# Patient Record
Sex: Female | Born: 1975 | Race: White | Hispanic: No | Marital: Married | Smoking: Current every day smoker
Health system: Southern US, Community
[De-identification: ages and names within clinical notes are randomized; demographics above are authoritative.]

## PROBLEM LIST (undated history)

## (undated) DIAGNOSIS — Z87442 Personal history of urinary calculi: Secondary | ICD-10-CM

## (undated) DIAGNOSIS — A64 Unspecified sexually transmitted disease: Secondary | ICD-10-CM

## (undated) DIAGNOSIS — L0390 Cellulitis, unspecified: Secondary | ICD-10-CM

## (undated) DIAGNOSIS — O24919 Unspecified diabetes mellitus in pregnancy, unspecified trimester: Secondary | ICD-10-CM

## (undated) HISTORY — DX: Personal history of urinary calculi: Z87.442

## (undated) HISTORY — DX: Unspecified sexually transmitted disease: A64

## (undated) HISTORY — DX: Unspecified diabetes mellitus in pregnancy, unspecified trimester: O24.919

## (undated) HISTORY — DX: Cellulitis, unspecified: L03.90

---

## 2000-06-29 HISTORY — PX: OTHER SURGICAL HISTORY: SHX169

## 2004-05-19 ENCOUNTER — Inpatient Hospital Stay (HOSPITAL_COMMUNITY): Admission: AD | Admit: 2004-05-19 | Discharge: 2004-05-19 | Payer: Self-pay | Admitting: *Deleted

## 2004-05-25 ENCOUNTER — Inpatient Hospital Stay (HOSPITAL_COMMUNITY): Admission: AD | Admit: 2004-05-25 | Discharge: 2004-05-26 | Payer: Self-pay | Admitting: Family Medicine

## 2004-05-26 ENCOUNTER — Inpatient Hospital Stay (HOSPITAL_COMMUNITY): Admission: AD | Admit: 2004-05-26 | Discharge: 2004-05-26 | Payer: Self-pay | Admitting: Obstetrics and Gynecology

## 2006-11-29 ENCOUNTER — Ambulatory Visit: Payer: Self-pay | Admitting: Family Medicine

## 2006-11-29 ENCOUNTER — Ambulatory Visit (HOSPITAL_COMMUNITY): Admission: RE | Admit: 2006-11-29 | Discharge: 2006-11-29 | Payer: Self-pay | Admitting: Obstetrics & Gynecology

## 2006-12-06 ENCOUNTER — Ambulatory Visit: Payer: Self-pay | Admitting: Obstetrics & Gynecology

## 2006-12-13 ENCOUNTER — Ambulatory Visit: Payer: Self-pay | Admitting: Obstetrics & Gynecology

## 2006-12-20 ENCOUNTER — Ambulatory Visit: Payer: Self-pay | Admitting: Obstetrics & Gynecology

## 2006-12-27 ENCOUNTER — Ambulatory Visit: Payer: Self-pay | Admitting: Obstetrics and Gynecology

## 2007-01-03 ENCOUNTER — Ambulatory Visit: Payer: Self-pay | Admitting: Family Medicine

## 2007-01-10 ENCOUNTER — Ambulatory Visit: Payer: Self-pay | Admitting: Obstetrics and Gynecology

## 2007-01-17 ENCOUNTER — Ambulatory Visit: Payer: Self-pay | Admitting: Obstetrics & Gynecology

## 2007-01-24 ENCOUNTER — Ambulatory Visit: Payer: Self-pay | Admitting: Obstetrics and Gynecology

## 2007-01-31 ENCOUNTER — Ambulatory Visit: Payer: Self-pay | Admitting: Family Medicine

## 2007-02-07 ENCOUNTER — Ambulatory Visit: Payer: Self-pay | Admitting: Family Medicine

## 2007-02-11 ENCOUNTER — Inpatient Hospital Stay (HOSPITAL_COMMUNITY): Admission: AD | Admit: 2007-02-11 | Discharge: 2007-02-11 | Payer: Self-pay | Admitting: Obstetrics & Gynecology

## 2007-02-11 ENCOUNTER — Ambulatory Visit: Payer: Self-pay | Admitting: Obstetrics & Gynecology

## 2007-02-14 ENCOUNTER — Ambulatory Visit (HOSPITAL_COMMUNITY): Admission: RE | Admit: 2007-02-14 | Discharge: 2007-02-14 | Payer: Self-pay | Admitting: Obstetrics & Gynecology

## 2007-02-14 ENCOUNTER — Ambulatory Visit: Payer: Self-pay | Admitting: Obstetrics & Gynecology

## 2007-02-21 ENCOUNTER — Inpatient Hospital Stay (HOSPITAL_COMMUNITY): Admission: AD | Admit: 2007-02-21 | Discharge: 2007-02-21 | Payer: Self-pay | Admitting: Obstetrics & Gynecology

## 2007-02-21 ENCOUNTER — Ambulatory Visit: Payer: Self-pay | Admitting: Physician Assistant

## 2007-02-21 ENCOUNTER — Ambulatory Visit: Payer: Self-pay | Admitting: Obstetrics & Gynecology

## 2007-02-23 ENCOUNTER — Ambulatory Visit: Payer: Self-pay | Admitting: *Deleted

## 2007-02-23 ENCOUNTER — Inpatient Hospital Stay (HOSPITAL_COMMUNITY): Admission: AD | Admit: 2007-02-23 | Discharge: 2007-02-24 | Payer: Self-pay | Admitting: Obstetrics & Gynecology

## 2008-03-14 IMAGING — US US OB FOLLOW-UP
1 series · 14 of 25 positions shown · non-contrast
Comparison: none

OBSTETRICAL ULTRASOUND:

 This ultrasound exam was performed in the [HOSPITAL] Ultrasound Department.  The OB US report was generated in the AS system, and faxed to the ordering physician.  This report is also available in [REDACTED] PACS.

[Series 1: us ob follow-up · 0.30mm/px · 14 of 25 slices shown]
[im 1/25]
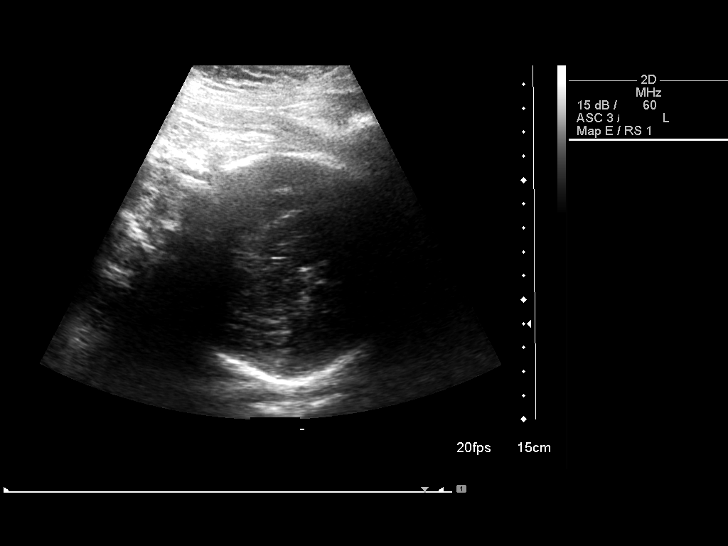
[im 3/25]
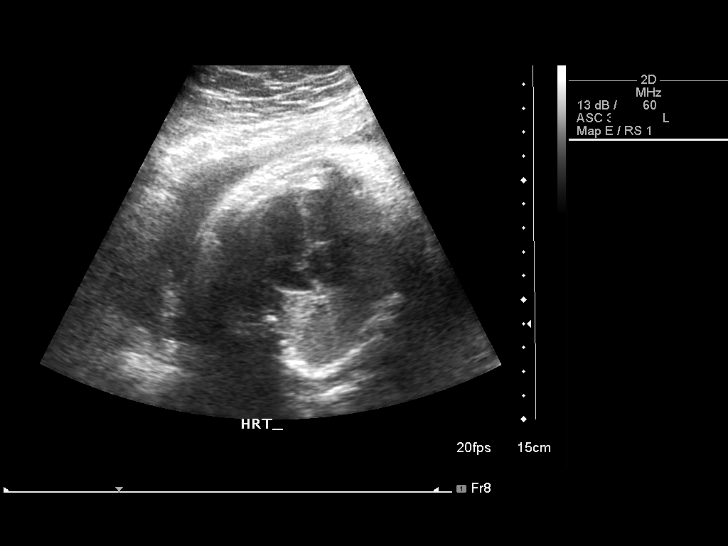
[im 5/25]
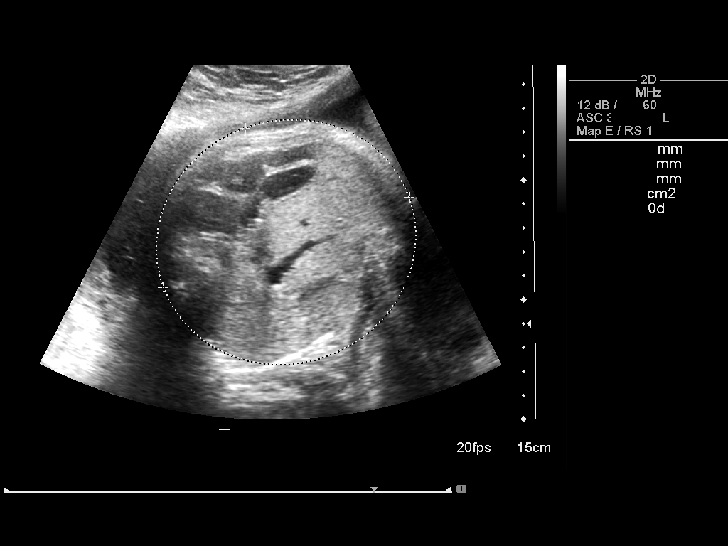
[im 7/25]
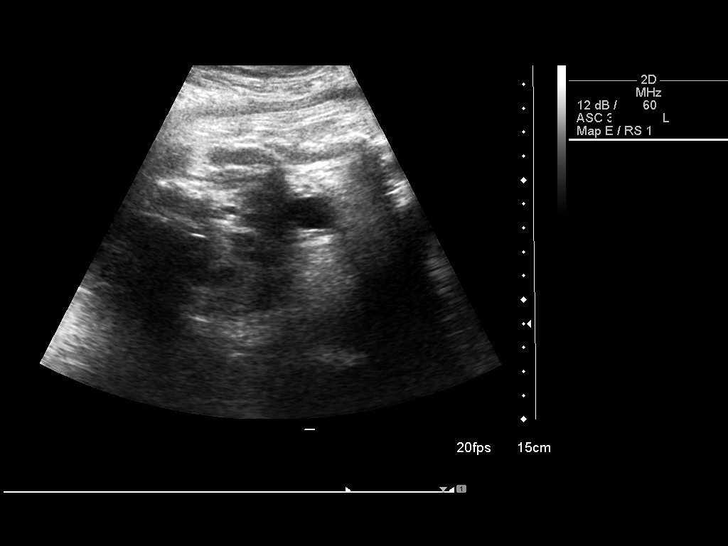
[im 9/25]
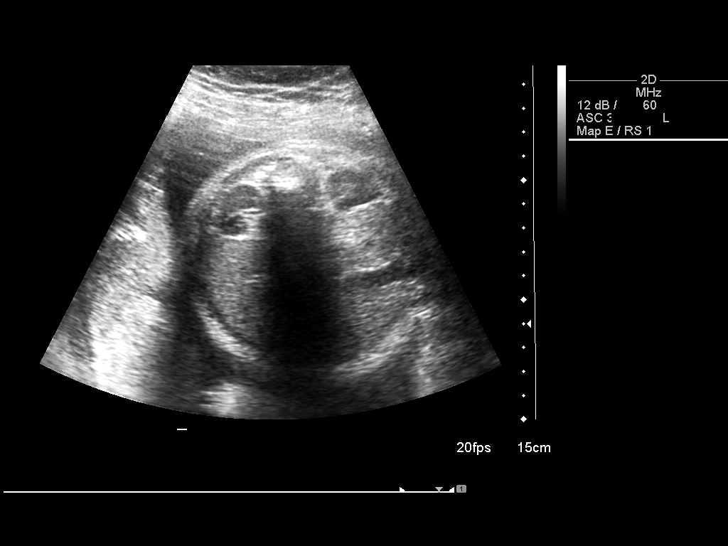
[im 10/25]
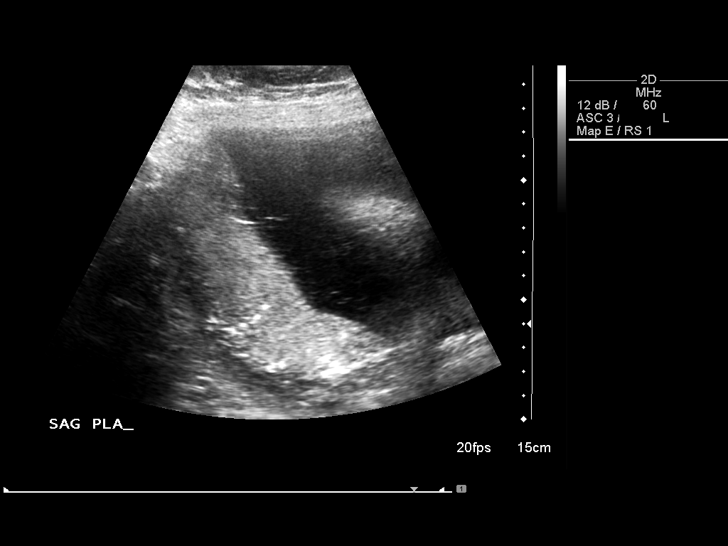
[im 12/25]
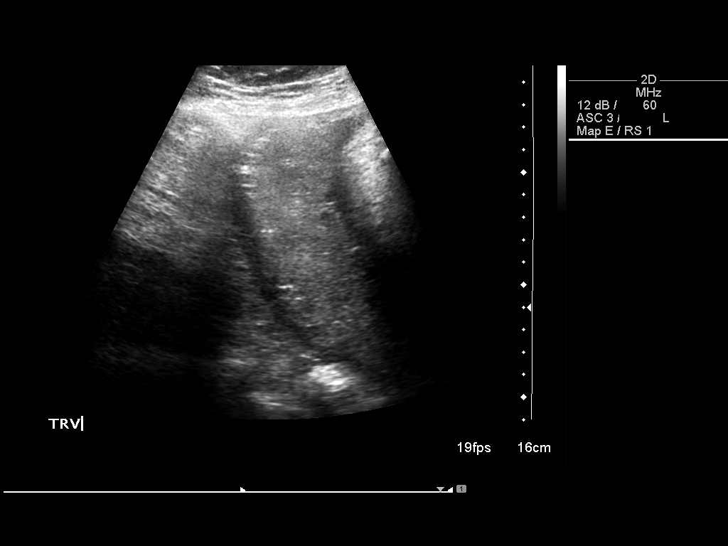
[im 14/25]
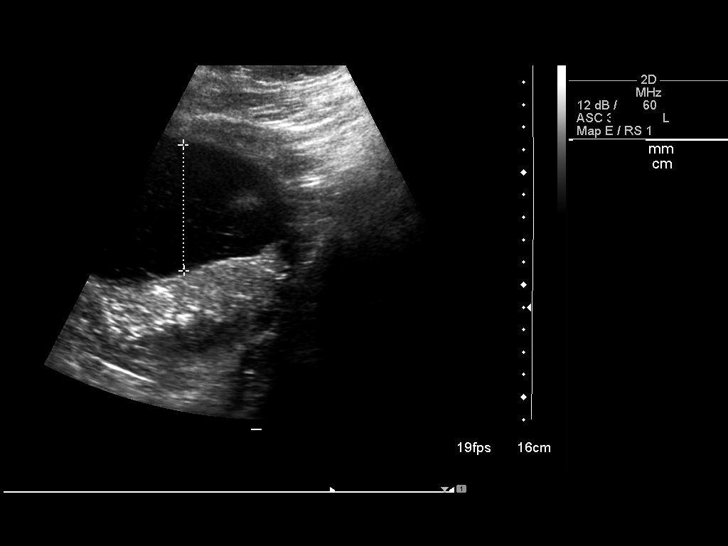
[im 16/25]
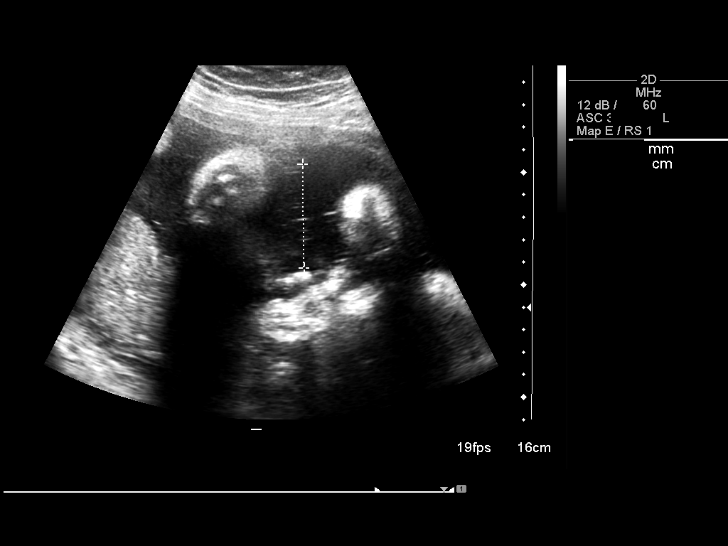
[im 17/25]
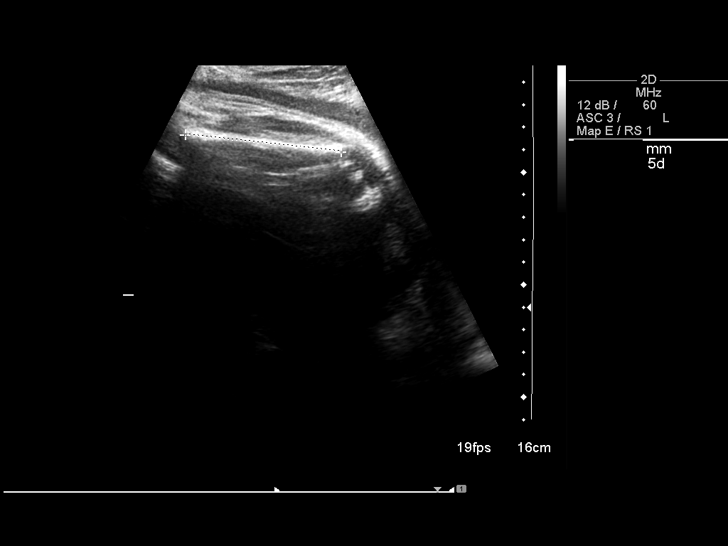
[im 19/25]
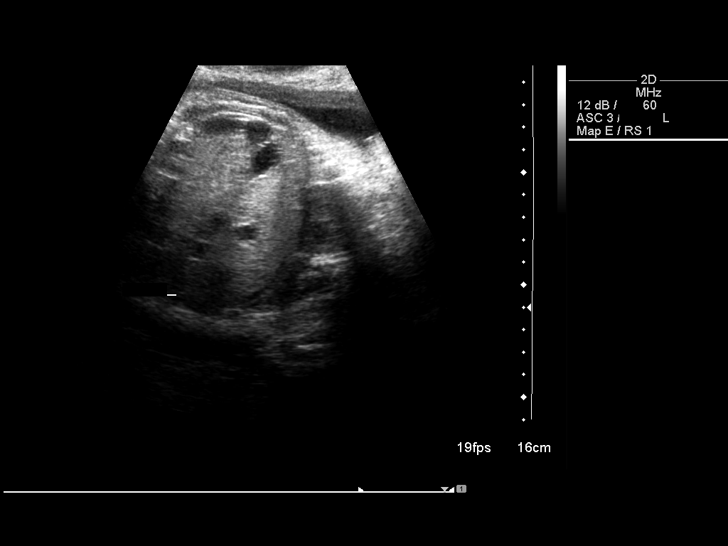
[im 21/25]
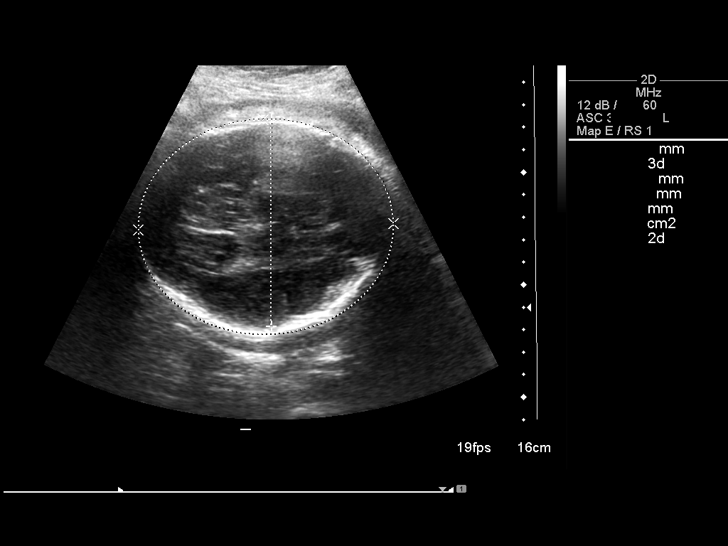
[im 23/25]
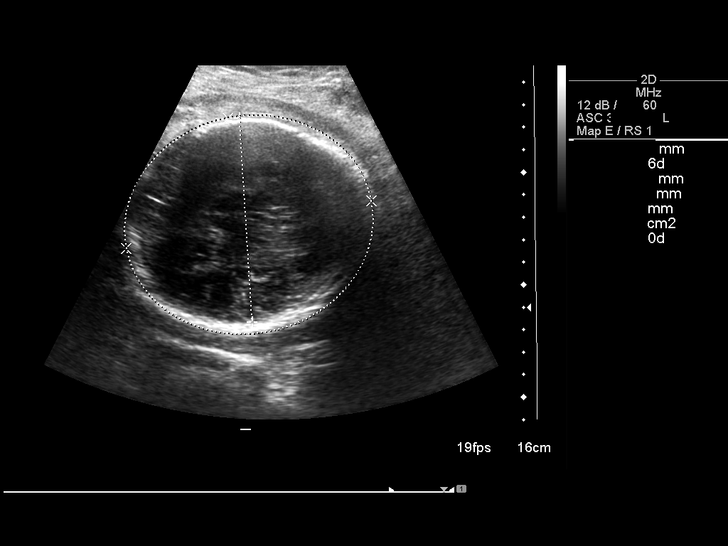
[im 25/25]
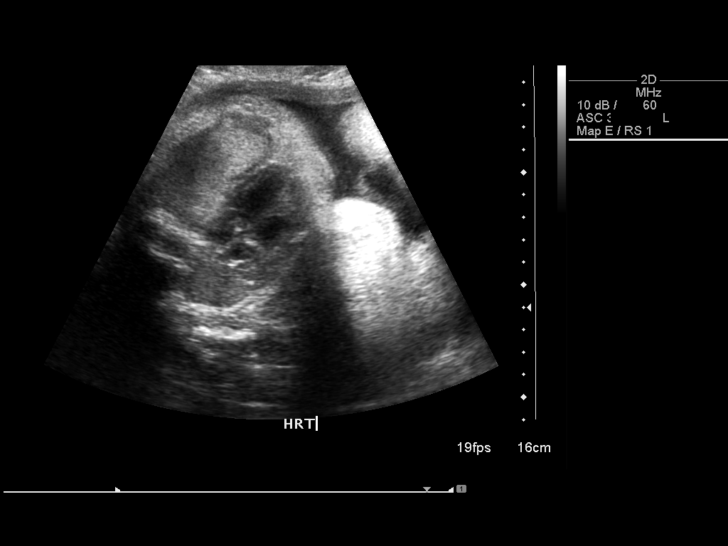

[14 of 25 positions shown; findings below may reference images not displayed]

IMPRESSION: See AS Obstetric US report.

## 2010-06-29 NOTE — L&D Delivery Note (Signed)
Delivery Note At 9:45 AM a viable female was delivered via  (Presentation:LOA ;  ).  APGAR:8 and 9 , ; weight .   Placenta status:spont via shultz , .  Cord:3 VC  with the following complications:none .    Anesthesia:  epidural Episiotomy: none Lacerations: none Suture Repair: none Est. Blood Loss350 (mL):   Mom to postpartum.  Baby to nursery-stable.  Zerita Boers 05/24/2011, 9:54 AM

## 2010-11-28 DIAGNOSIS — L0390 Cellulitis, unspecified: Secondary | ICD-10-CM

## 2010-11-28 DIAGNOSIS — A64 Unspecified sexually transmitted disease: Secondary | ICD-10-CM

## 2010-11-28 HISTORY — DX: Unspecified sexually transmitted disease: A64

## 2010-11-28 HISTORY — DX: Cellulitis, unspecified: L03.90

## 2011-03-07 LAB — GC/CHLAMYDIA PROBE AMP, GENITAL: Chlamydia: NEGATIVE

## 2011-03-18 ENCOUNTER — Ambulatory Visit (INDEPENDENT_AMBULATORY_CARE_PROVIDER_SITE_OTHER): Payer: Medicaid Other | Admitting: Obstetrics and Gynecology

## 2011-03-18 ENCOUNTER — Other Ambulatory Visit: Payer: Self-pay | Admitting: Obstetrics and Gynecology

## 2011-03-18 ENCOUNTER — Other Ambulatory Visit (HOSPITAL_COMMUNITY)
Admission: RE | Admit: 2011-03-18 | Discharge: 2011-03-18 | Disposition: A | Discharge status: Home / Self Care | Payer: Medicaid Other | Source: Ambulatory Visit | Attending: Obstetrics and Gynecology | Admitting: Obstetrics and Gynecology

## 2011-03-18 ENCOUNTER — Ambulatory Visit (HOSPITAL_COMMUNITY)
Admission: RE | Admit: 2011-03-18 | Discharge: 2011-03-18 | Disposition: A | Discharge status: Home / Self Care | Payer: Medicaid Other | Source: Ambulatory Visit | Attending: Obstetrics and Gynecology | Admitting: Obstetrics and Gynecology

## 2011-03-18 DIAGNOSIS — Z113 Encounter for screening for infections with a predominantly sexual mode of transmission: Secondary | ICD-10-CM | POA: Insufficient documentation

## 2011-03-18 DIAGNOSIS — O24919 Unspecified diabetes mellitus in pregnancy, unspecified trimester: Secondary | ICD-10-CM | POA: Insufficient documentation

## 2011-03-18 DIAGNOSIS — O0930 Supervision of pregnancy with insufficient antenatal care, unspecified trimester: Secondary | ICD-10-CM

## 2011-03-18 DIAGNOSIS — E1110 Type 2 diabetes mellitus with ketoacidosis without coma: Secondary | ICD-10-CM

## 2011-03-18 DIAGNOSIS — Z124 Encounter for screening for malignant neoplasm of cervix: Secondary | ICD-10-CM

## 2011-03-18 DIAGNOSIS — O409XX0 Polyhydramnios, unspecified trimester, not applicable or unspecified: Secondary | ICD-10-CM | POA: Insufficient documentation

## 2011-03-18 DIAGNOSIS — F1411 Cocaine abuse, in remission: Secondary | ICD-10-CM | POA: Insufficient documentation

## 2011-03-18 DIAGNOSIS — O34219 Maternal care for unspecified type scar from previous cesarean delivery: Secondary | ICD-10-CM | POA: Insufficient documentation

## 2011-03-18 DIAGNOSIS — O99810 Abnormal glucose complicating pregnancy: Secondary | ICD-10-CM

## 2011-03-18 DIAGNOSIS — Z01419 Encounter for gynecological examination (general) (routine) without abnormal findings: Secondary | ICD-10-CM | POA: Insufficient documentation

## 2011-03-18 HISTORY — DX: Cocaine abuse, in remission: F14.11

## 2011-03-18 LAB — POCT URINALYSIS DIP (DEVICE)
Bilirubin Urine: NEGATIVE
Glucose, UA: 500 mg/dL — AB
Ketones, ur: NEGATIVE mg/dL
Nitrite: NEGATIVE
Protein, ur: NEGATIVE mg/dL
Specific Gravity, Urine: 1.025 (ref 1.005–1.030)
Urobilinogen, UA: 0.2 mg/dL (ref 0.0–1.0)
pH: 5.5 (ref 5.0–8.0)

## 2011-03-18 NOTE — Progress Notes (Signed)
Addended by: Toula Moos on: 03/18/2011 04:26 PM   Modules accepted: Orders

## 2011-03-18 NOTE — Progress Notes (Signed)
Edema-feet, hx of preterm labor- was on 17p last pregnancy.  Spotted last week x 2.  White discharge, no odor.

## 2011-03-18 NOTE — Progress Notes (Signed)
This patient is a 35 year old white female gravida 9 para 37. History 1 cesarean section but she's had a VBAC since that time. He section was done for fetal distress. She had one crib death. 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 She has not had prenatal care up to this point should be 29 weeks 3 days based on her last period. She'll over measures significantly larger. She is a type II diabetic who is on Glucophage but has been off of that for the past 3 months because she thought it was dangers for the baby. She was originally taking 750 mg twice a day. She has a history of polyhydramnios with previous pregnancy. We'll get an ultrasound today to evaluate the size of the baby and referred the patient to high-risk clinic ASAP. I've told her to restart her Glucophage.

## 2011-03-19 LAB — OBSTETRIC PANEL
Antibody Screen: NEGATIVE
Basophils Absolute: 0 10*3/uL (ref 0.0–0.1)
Basophils Relative: 0 % (ref 0–1)
Eosinophils Absolute: 0 10*3/uL (ref 0.0–0.7)
Eosinophils Relative: 0 % (ref 0–5)
HCT: 37.9 % (ref 36.0–46.0)
Hemoglobin: 12.9 g/dL (ref 12.0–15.0)
Hepatitis B Surface Ag: NEGATIVE
Lymphocytes Relative: 23 % (ref 12–46)
Lymphs Abs: 3 10*3/uL (ref 0.7–4.0)
MCH: 28.9 pg (ref 26.0–34.0)
MCHC: 34 g/dL (ref 30.0–36.0)
MCV: 84.8 fL (ref 78.0–100.0)
Monocytes Absolute: 0.8 10*3/uL (ref 0.1–1.0)
Monocytes Relative: 6 % (ref 3–12)
Neutro Abs: 9.4 10*3/uL — ABNORMAL HIGH (ref 1.7–7.7)
Neutrophils Relative %: 71 % (ref 43–77)
Platelets: 278 10*3/uL (ref 150–400)
RBC: 4.47 MIL/uL (ref 3.87–5.11)
RDW: 13.5 % (ref 11.5–15.5)
Rh Type: POSITIVE
Rubella: 40.9 IU/mL — ABNORMAL HIGH
WBC: 13.3 10*3/uL — ABNORMAL HIGH (ref 4.0–10.5)

## 2011-03-19 LAB — HEMOGLOBIN A1C
Hgb A1c MFr Bld: 7.2 % — ABNORMAL HIGH (ref <5.7)
Mean Plasma Glucose: 160 mg/dL — ABNORMAL HIGH (ref <117)

## 2011-03-22 LAB — CULTURE, BETA STREP (GROUP B ONLY)

## 2011-03-22 LAB — CULTURE, OB URINE: Colony Count: 40000

## 2011-03-23 ENCOUNTER — Ambulatory Visit (INDEPENDENT_AMBULATORY_CARE_PROVIDER_SITE_OTHER): Payer: Medicaid Other | Admitting: Physician Assistant

## 2011-03-23 ENCOUNTER — Encounter: Payer: Medicaid Other | Attending: Obstetrics & Gynecology | Admitting: Dietician

## 2011-03-23 DIAGNOSIS — O99810 Abnormal glucose complicating pregnancy: Secondary | ICD-10-CM

## 2011-03-23 DIAGNOSIS — N760 Acute vaginitis: Secondary | ICD-10-CM

## 2011-03-23 DIAGNOSIS — O2390 Unspecified genitourinary tract infection in pregnancy, unspecified trimester: Secondary | ICD-10-CM

## 2011-03-23 DIAGNOSIS — O24919 Unspecified diabetes mellitus in pregnancy, unspecified trimester: Secondary | ICD-10-CM

## 2011-03-23 LAB — POCT URINALYSIS DIP (DEVICE)
Bilirubin Urine: NEGATIVE
Glucose, UA: 500 mg/dL — AB
Ketones, ur: NEGATIVE mg/dL
Leukocytes, UA: NEGATIVE
Nitrite: NEGATIVE
Protein, ur: NEGATIVE mg/dL
Specific Gravity, Urine: 1.025 (ref 1.005–1.030)
Urobilinogen, UA: 0.2 mg/dL (ref 0.0–1.0)
pH: 5 (ref 5.0–8.0)

## 2011-03-23 MED ORDER — ACCU-CHEK FASTCLIX LANCETS MISC: 1.0000 | Freq: Four times a day (QID) | Status: AC

## 2011-03-23 MED ORDER — FLUCONAZOLE 150 MG PO TABS: 150.0000 mg | ORAL_TABLET | Freq: Once | ORAL | Status: AC

## 2011-03-23 MED ORDER — GLUCOSE BLOOD VI STRP: ORAL_STRIP | Status: DC

## 2011-03-23 MED ORDER — GLYBURIDE 5 MG PO TABS
5.0000 mg | ORAL_TABLET | Freq: Every day | ORAL | Status: DC
Start: 2011-03-23 — End: 2011-03-30

## 2011-03-23 NOTE — Progress Notes (Signed)
Completed review of the carbohydrate restricted diet for pregnancy and diabetes.  This included carb counting, label reading and portions using the exchange list.  Review of the meter and glucose testing schedule.  Provided an Accu-Chek [employer] Kit (Nano) Lot [id number] and expiration 06/17/2012.   Counseled to check bl. Glucose fasting and 2 hours post-mea.  To keep blood glucose log and bring log to clinic.  Advised to keep a carb/food log as well .  Today, her glucose at 3.5 hours post meal was 176.  Will follow-up in clinic.  Provided handouts;  Nutrition, Diabetes, and Pregnancy; Carb counting reference, and monitor and pregnancy illustrations.  Maggie Shalonda Sachse, RN, RD, CDEl

## 2011-03-23 NOTE — Progress Notes (Signed)
Pt is not taking Metformin as prescribed. Only checking BS once daily. 2pp after lunch ~ 160. Reviewed risk of DM in pregnancy with pt and encouraged to check BS 4 x daily. Will start glyberide 5mg  at HS today; Maggie and Social work today. FU 1 week to re-eval BS, anticipate need for insulin. Last [place] 5 days ago: growth 47% and AFI 20cm (80%). Will start antenatal @ 32 weeks. Pt desires TOLAC, consent reviewed.

## 2011-03-23 NOTE — Progress Notes (Signed)
Addended by: August Luz on: 03/23/2011 11:17 AM   Modules accepted: Orders

## 2011-03-23 NOTE — Progress Notes (Signed)
Swelling in feet at times. Thick vaginal discharge. Itching and burning that is extreme. Pain level is 10.

## 2011-03-23 NOTE — Patient Instructions (Addendum)
Diabetes and Exercise Regular exercise is important and can help:   Control blood glucose (sugar).   Decrease blood pressure.   Control blood lipids (cholesterol and triglycerides).   Improve overall health.  BENEFITS FROM EXERCISE:  Improved fitness.   Improved flexibility.   Improved endurance.   Increased bone density.   Weight control.   Increased muscle strength.   Decreased body fat.   Improvement of the body's use of a hormone called insulin.   Increased insulin sensitivity.   Reduction of insulin needs.   Helps you feel better.   Reduces stress and tension.  People with diabetes who add exercise to their lifestyle gain additional benefits.   Weight loss.   Reduces appetite.   Improves body's use of blood glucose (sugar).   Decreases risk factors for heart disease:   Lowering of cholesterol and triglycerides.   Raising the level of good cholesterol (high-density lipoproteins [HDL]).   Lowering blood sugar.   Decreases blood pressure.  TYPE 1 DIABETES AND EXERCISE  Exercise will usually lower your blood glucose.   If blood glucose is greater than 240 mg/dl, check urine ketones. If ketones are present, do not exercise.   Location of the insulin injection sites may need to be adjusted with exercise. Avoid injecting insulin into areas of the body that will be exercised. For example, avoid injecting insulin into:   The arms when playing tennis.   The legs when jogging. For more information, discuss this with your caregiver.   Keep a record of:   Food intake.   Type and amount of exercise.   Expected peak times of insulin action.   Blood glucose (sugar) levels.  Do this before, during and after exercise. Review your records with your caregiver(s). This will help you to develop guidelines for adjusting food intake and/or insulin amounts.  TYPE 2 DIABETES AND EXERCISE  Regular physical activity can help control blood glucose.   Exercise is  important because it may:   Increase the body's sensitivity to insulin.   Improve blood glucose control.   Exercise reduces the risk of heart disease. It decreases serum cholesterol and triglycerides. It also lowers blood pressure.   Those who take insulin or oral hypoglycemic agents should watch for signs of hypoglycemia. These signs include dizziness, shaking, sweating, chills and confusion.   Body water is lost during exercise. It must be replaced. This will help to avoid loss of body fluids (dehydration) and/or heat stroke.  Be sure to talk to your caregiver before starting an exercise program to make sure it is safe for you. Remember, any activity is better than none.  Document Released: 09/05/2003 Document Re-Released: 04/12/2009 ExitCare Patient Information 2011 [employer].2000 Calorie Diabetic Diet The 2000 calorie diabetic diet is designed for eating up to 2000 calories each day. Following this diet and making healthy meal choices can help improve overall health. It controls blood sugar levels. It can also lower blood pressure and cholesterol. SERVING SIZES Measuring foods and serving sizes helps to make sure you are getting the right amount of food. The list below tells how big or small some common serving sizes are.  1 ounce (oz).........................4 stacked dice.   3 oz.....................................Marland KitchenDeck of cards.   1 teaspoon (tsp)..................Marland KitchenTip of little finger.   1 tablespoon (Tbsp)...........Marland KitchenMarland KitchenThumb.   2 Tbsp.................................Marland KitchenGolf ball.   1/2 cup................................Marland KitchenHalf of a fist.   1 cup...................................Marland KitchenA fist.  GUIDELINES FOR CHOOSING FOODS The goal of this diet is to eat a variety of foods  and limit calories to 2000 each day. This can be done by choosing foods that are low in calories and fat. The diet also suggests eating small amounts of food often. Doing this helps control your blood sugar  levels so they do not get too high or too low. Each meal or snack should contain a protein food source to help you feel more satisfied and to stabilize your blood sugar. Try to eat about the same amount of food around the same time each day. This includes weekend days, travel days, and days off work. Space your meals about 4 to 5 hours apart and add a snack between them if you wish. For example, a daily food plan could include breakfast, a morning snack, lunch, dinner, and an evening snack. Healthy meals and snacks include whole grains, vegetables, fruits, lean meats, poultry, fish, and dairy products. As you plan your meals, choose a variety of foods. Choose from the bread and starches, vegetables, fruit, dairy, and meat/protein groups. Examples of foods from each group are listed below with their suggested serving sizes. Use measuring cups and spoons to become familiar with what a healthy portion looks like. Bread and Starches Each serving equals 15 grams of carbohydrates 1 slice bread 1/4 bagel 3/4 cup or 1 cup cold cereal (unsweetened) 1/2 cup hot cereal or mashed potatoes 1 small potato (size of a computer mouse) 1/3 cup cooked pasta or rice 1/2 English muffin 1 cup broth-based soup 3 cups popcorn 4 to 6 whole-wheat crackers 1/2 cup cooked beans, peas, or corn Vegetables Each serving equals 5 grams of carbohydrates 1/2 cup cooked vegetables 1 cup raw vegetables 1/2 cup tomato juice Fruit Each serving equals 15 grams of carbohydrates 1 small apple, banana, or orange 1 1/4 cup watermelon or strawberries 1/2 cup applesauce (no sugar added) 2 Tbsp raisins 1/2 banana 1/2 cup unsweetened canned fruit 1/2 cup unsweetened fruit juice Dairy Each serving equals 12 to 15 grams of carbohydrates 1 cup fat-free milk 6 oz artificially sweetened yogurt 1 cup buttermilk 1 cup soy milk Meat/Protein 1 large egg 2 to 3 oz meat, poultry, or fish 1/2 cup cottage cheese 1 Tbsp peanut butter 1/2  cup tofu 1 oz cheese 1/4 cup tuna canned in water SAMPLE 2000 CALORIE DIET PLAN Breakfast  1 English muffin (2 carb choices)   Reduced fat cream cheese, 1 Tbsp   1 scrambled egg   1/2 grapefruit (1 carb choice)   Fat-free milk, 1 cup (1 carb choice)  Morning Snack  Artificially sweetened yogurt, 6 oz (1 carb choice)   2 Tbsp chopped nuts   1 small peach (1 carb choice)  Lunch  Grilled chicken sandwich:   1 hamburger bun (2 carb choices)   2 oz chicken breast   1 lettuce leaf   2 slices tomato   Reduced fat mayonnaise, 1 Tbsp   Carrot sticks, 1 cup   Celery, 1 cup   1 small apple (1 carb choice)   Fat-free milk, 1 cup (1 carb choice)  Afternoon Snack  1/2 cup low-fat cottage cheese   1 1/4 cups strawberries (1 carb choice)  Dinner  Steak fajitas:   2 oz lean steak   1 whole-wheat tortilla, 8 inches (1.5 carb choices)   Shredded lettuce, 1/4 cup   2 slices tomato   Salsa, 1/4 cup   Low-fat sour cream, 2 Tbsp   Brown rice, 1/3 cup (1 carb choice)   1 small orange (1 carb choice)  Evening  Snack  4 reduced fat whole-wheat crackers (1 carb choice)   1 Tbsp peanut butter   12 to 15 grapes (1 carb choice)  MEAL PLAN Use this worksheet to help you make a daily meal plan based on the 2000 calorie diabetic diet suggestions. The total amount of carbohydrates in your meal or snack is more important than making sure you include all of the food groups at every meal or snack. If you are using this plan to help you control your blood glucose, you may interchange carbohydrate containing foods (dairy, starches, and fruits). Choose a variety of fresh foods of varying colors and flavors. You can choose from the following foods to build your day's meals:  11 Starches.   4 Vegetables.   3 Fruits.   3 Dairy.   8 oz Meat.   Up to 6 Fats.  Your dietician can use this worksheet to help you decide how many servings and what types of foods are right for  you. Breakfast Food Group and Servings Food Choice Starches ____________________________________________ Dairy ______________________________________________ Fruit _______________________________________________ Meat _______________________________________________ Fat_________________________________________________ Serina Cowper Food Group and Servings Food Choice Starch _____________________________________________ Meat ______________________________________________ Vegetables __________________________________________ Fruit _______________________________________________ Dairy_______________________________________________ Fat_________________________________________________ Afternoon Snack Food Group and Servings Food Choice Starch_________________________________________________ Meat__________________________________________________ Fruit__________________________________________________ Linford Arnold Group and Servings Food Choice Starches ____________________________________________ Meat _______________________________________________ Dairy _______________________________________________ Vegetables ___________________________________________ Fruit ________________________________________________ Fat__________________________________________________ Evening Snack Food Group and Servings Food Choice Fruit _______________________________________________ Meat _______________________________________________ Starch ______________________________________________ Daily Totals Starches _________________________ Vegetables _______________________ Fruit ___________________________ Dairy ___________________________ Meat ____________________________ Fat _____________________________ Document Released: 01/05/2005 Document Re-Released: 12/03/2009 ExitCare Patient Information 2011 [place], [place].

## 2011-03-30 ENCOUNTER — Ambulatory Visit (INDEPENDENT_AMBULATORY_CARE_PROVIDER_SITE_OTHER): Payer: Medicaid Other | Admitting: Family Medicine

## 2011-03-30 DIAGNOSIS — O24919 Unspecified diabetes mellitus in pregnancy, unspecified trimester: Secondary | ICD-10-CM

## 2011-03-30 DIAGNOSIS — O99810 Abnormal glucose complicating pregnancy: Secondary | ICD-10-CM

## 2011-03-30 DIAGNOSIS — O0990 Supervision of high risk pregnancy, unspecified, unspecified trimester: Secondary | ICD-10-CM

## 2011-03-30 DIAGNOSIS — N760 Acute vaginitis: Secondary | ICD-10-CM

## 2011-03-30 DIAGNOSIS — O24419 Gestational diabetes mellitus in pregnancy, unspecified control: Secondary | ICD-10-CM | POA: Insufficient documentation

## 2011-03-30 LAB — POCT URINALYSIS DIP (DEVICE)
Bilirubin Urine: NEGATIVE
Glucose, UA: 500 mg/dL — AB
Hgb urine dipstick: NEGATIVE
Ketones, ur: NEGATIVE mg/dL
Nitrite: NEGATIVE
Protein, ur: NEGATIVE mg/dL
Specific Gravity, Urine: >=1.03 (ref 1.005–1.030)
Urobilinogen, UA: 0.2 mg/dL (ref 0.0–1.0)
pH: 5.5 (ref 5.0–8.0)

## 2011-03-30 MED ORDER — GLUCOSE BLOOD VI STRP: ORAL_STRIP | Status: DC

## 2011-03-30 MED ORDER — GLYBURIDE 5 MG PO TABS: 5.0000 mg | ORAL_TABLET | Freq: Every day | ORAL | Status: DC

## 2011-03-30 MED ORDER — GLYBURIDE 5 MG PO TABS: 5.0000 mg | ORAL_TABLET | Freq: Two times a day (BID) | ORAL | Status: DC

## 2011-03-30 MED ORDER — PRENATAL RX 60-1 MG PO TABS: 1.0000 | ORAL_TABLET | Freq: Every day | ORAL | Status: DC

## 2011-03-30 NOTE — Patient Instructions (Signed)
SEEK IMMEDIATE MEDICAL CARE IF:  You develop contractions that continue to become stronger, more regular, and closer together.   You have a gushing, burst or leaking of fluid from the vagina.   An oral temperature above 100.4 develops.   You have passage of blood-tinged mucus.   You develop vaginal bleeding.   You develop continuous belly (abdominal) pain.   You have low back pain that you never had before.   You feel the baby's head pushing down causing pelvic pressure.   The baby is not moving as much as it used to.  Document Released: 06/15/2005 Document Re-Released: 12/03/2009 [facility] Patient Information 2011 [place], [place].Gestational Diabetes Mellitus (GDM) Gestational diabetes mellitus (GDM) is diabetes that occurs only during pregnancy. This happens when the body cannot properly handle the glucose (sugar) that increases in the blood after eating. During pregnancy, insulin resistance (reduced sensitivity to insulin) occurs because of the release of hormones from the placenta. Usually, the pancreas of pregnant women produces enough insulin to overcome the resistance that occurs. However, in gestational diabetes, the insulin is there but it does not work effectively. If the resistance is severe enough that the pancreas does not produce enough insulin, extra glucose builds up in the blood.  WHO IS AT RISK FOR DEVELOPING GESTATIONAL DIABETES?  Women with a history of diabetes in the family.   Women over age 5.   Women who are overweight.   Women in certain ethnic groups (Hispanic, African American, Native American, [place] and [place] Islander).  WHAT CAN HAPPEN TO THE BABY? If the mother's blood glucose is too high while she is pregnant, the extra sugar will travel through the umbilical cord to the baby. Some of the problems the baby may have are:  Large Baby - If the baby receives too much sugar, the baby will gain more weight. This may cause the baby to be too large to be  born normally (vaginally) and a Cesarean section (C-section) may be needed.   Low Blood Glucose (hypoglycemia) - The baby makes extra insulin, in response to the extra sugar its gets from its mother. When the baby is born and no longer needs this extra insulin, the baby's blood glucose level may drop.   Jaundice (yellow coloring of the skin and eyes) - This is fairly common in babies. It is caused from a build-up of the chemical called bilirubin. This is rarely serious, but is seen more often in babies whose mothers had gestational diabetes.  RISKS TO THE MOTHER Women who have had gestational diabetes may be at higher risk for some problems, including:  Preeclampsia or toxemia, which includes problems with high blood pressure. Blood pressure and protein levels in the urine must be checked frequently.   Infections.   Cesarean section (C-section) for delivery.   Developing Type 2 diabetes later in life. About 30-50% will develop diabetes later, especially if obese.  DIAGNOSIS The hormones that cause insulin resistance are highest at about 24-28 weeks of pregnancy. If symptoms are experienced, they are much like symptoms you would normally expect during pregnancy.  GDM is often diagnosed using a two part method: 1. After 24-28 weeks of pregnancy, the woman drinks a glucose solution and takes a blood test. If the glucose level is high, a second test will be given.  2. Oral Glucose Tolerance Test (OGTT) which is 3 hours long - After not eating overnight, the blood glucose is checked. The woman drinks a glucose solution, and hourly blood glucose tests  are taken.  If the woman has risk factors for GDM, the caregiver may test earlier than 24 weeks of pregnancy. TREATMENT Treatment of GDM is directed at keeping the mother's blood glucose level normal, and may include:  Meal planning.   Taking insulin or other medicine to control your blood glucose level.   Exercise.   Keeping a daily record of  the foods you eat.   Blood glucose monitoring and keeping a record of your blood glucose levels.   May monitor ketone levels in the urine, although this is no longer considered necessary in most pregnancies.  HOME CARE INSTRUCTIONS While you are pregnant:  Follow your caregiver's advice regarding your prenatal appointments, meal planning, exercise, medicines, vitamins, blood and other tests, and physical activities.   Keep a record of your meals, blood glucose tests, and the amount of insulin you are taking (if any). Show this to your caregiver at every prenatal visit.   If you have GDM, you may have problems with hypoglycemia (low blood glucose). You may suspect this if you become suddenly dizzy, feel shaky, and/or weak. If you think this is happening and you have a glucose meter, try to test your blood glucose level. Follow your caregiver's advice for when and how to treat your low blood glucose. Generally, the 15:15 rule is followed: Treat by consuming 15 grams of carbohydrates, wait 15 minutes, and recheck blood glucose. Examples of 15 grams of carbohydrates are:   1 cup skim or low fat milk.    cup juice.   3-4 glucose tablets.   5-6 hard candies.   1 small box raisins.    cup regular soda pop.   Practice good hygiene, to avoid infections.   Do not smoke.  SEEK MEDICAL CARE IF:  You develop abnormal vaginal discharge, with or without itching.   You become weak and tired more than expected.   You seem to sweat a lot.   You have a sudden increase in weight, 5 pounds or more in one week.   You are losing weight, 3 pounds or more in a week.   Your blood glucose level is high, and you need instructions on what to do about it.  SEEK IMMEDIATE MEDICAL CARE IF:  You develop a severe headache.   You faint or pass out.   You develop nausea and vomiting.   You become disoriented or confused.   You have a convulsion.   You develop vision problems.   You develop  stomach pain.   You develop vaginal bleeding.   You develop uterine contractions.   You have leaking or a gush of fluid from the vagina.  AFTER YOU HAVE THE BABY:  Go to all of your follow-up appointments, and have blood tests as advised by your caregiver.   Maintain a healthy lifestyle, to prevent diabetes in the future. This includes:   Following a healthy meal plan.   Controlling your weight.   Getting enough exercise and proper rest.   Do not smoke.   Breastfeed your baby if you can. This will lower the chance of you and your baby developing diabetes later in life.  For more information about diabetes, go to the American Diabetes Association at: PMFashions.com.cy. For more information about gestational diabetes, go to the [employer] at: RentRule.com.au. Document Released: 09/21/2000 Document Re-Released: 06/03/2009 [facility] Patient Information 2011 [place], [place].

## 2011-03-30 NOTE — Progress Notes (Signed)
Subjective:    Caitlin Faulkner is a 35 y.o. female being seen today for her obstetrical visit. She is at [redacted]w[redacted]d gestation. Patient reports backache. Fetal movement: normal.  Menstrual History: OB History    Grav Para Term Preterm Abortions TAB SAB Ect Mult Living   9 6 3 3 2 1  0 1 0 7      Patient's last menstrual period was 08/23/2010.     Objective:    BP 109/73  Temp 98.8 F (37.1 C)  Wt 90.855 kg (200 lb 4.8 oz)  LMP 08/23/2010 FHT:  145 BPM  Uterine Size: 31 cm  Presentation: unsure   Fasting CBG: 119-119-202-133-130-118-180 2 hr after Breakfast: [phone] 2 hr after Lunch: 113-105-127 2 hr after Dinner: 129-148-121-112 QHS: 118-127-141-189-144  Assessment:    Pregnancy 31 and 2/[redacted] weeks  Gestational Diabetes, Class B  Plan:  Will increase Glyburide to 5mg  BID and recheck fasting at next visit. May need to start insulin if still elevated CBGs. Pt to see Seward Grater again today. Pt declines Flu vaccine today. Will get [place] @ 32 wks and start 2x/weekly NST next week. Thinks she may want BTL after delivery. Desires VBAC, last delivery was vaginal  Follow up in 1 Week.

## 2011-03-30 NOTE — Progress Notes (Signed)
Having low back pain. Pt would like to get the flu shot today. Needs refill on PNV

## 2011-04-06 ENCOUNTER — Ambulatory Visit (INDEPENDENT_AMBULATORY_CARE_PROVIDER_SITE_OTHER): Payer: Medicaid Other | Admitting: Obstetrics & Gynecology

## 2011-04-06 ENCOUNTER — Encounter: Payer: Medicaid Other | Attending: Obstetrics & Gynecology | Admitting: Dietician

## 2011-04-06 ENCOUNTER — Encounter: Payer: Self-pay | Admitting: Obstetrics & Gynecology

## 2011-04-06 ENCOUNTER — Ambulatory Visit (HOSPITAL_COMMUNITY)
Admission: RE | Admit: 2011-04-06 | Discharge: 2011-04-06 | Disposition: A | Discharge status: Home / Self Care | Payer: Medicaid Other | Source: Ambulatory Visit | Attending: Family Medicine | Admitting: Family Medicine

## 2011-04-06 VITALS — Temp 97.8°F | Wt 206.0 lb

## 2011-04-06 DIAGNOSIS — O09219 Supervision of pregnancy with history of pre-term labor, unspecified trimester: Secondary | ICD-10-CM

## 2011-04-06 DIAGNOSIS — O24919 Unspecified diabetes mellitus in pregnancy, unspecified trimester: Secondary | ICD-10-CM | POA: Insufficient documentation

## 2011-04-06 DIAGNOSIS — Z23 Encounter for immunization: Secondary | ICD-10-CM

## 2011-04-06 DIAGNOSIS — O0930 Supervision of pregnancy with insufficient antenatal care, unspecified trimester: Secondary | ICD-10-CM | POA: Insufficient documentation

## 2011-04-06 DIAGNOSIS — O24419 Gestational diabetes mellitus in pregnancy, unspecified control: Secondary | ICD-10-CM

## 2011-04-06 DIAGNOSIS — O09529 Supervision of elderly multigravida, unspecified trimester: Secondary | ICD-10-CM | POA: Insufficient documentation

## 2011-04-06 DIAGNOSIS — O34219 Maternal care for unspecified type scar from previous cesarean delivery: Secondary | ICD-10-CM | POA: Insufficient documentation

## 2011-04-06 DIAGNOSIS — Z713 Dietary counseling and surveillance: Secondary | ICD-10-CM | POA: Insufficient documentation

## 2011-04-06 DIAGNOSIS — O09299 Supervision of pregnancy with other poor reproductive or obstetric history, unspecified trimester: Secondary | ICD-10-CM | POA: Insufficient documentation

## 2011-04-06 DIAGNOSIS — O09899 Supervision of other high risk pregnancies, unspecified trimester: Secondary | ICD-10-CM | POA: Insufficient documentation

## 2011-04-06 DIAGNOSIS — O99810 Abnormal glucose complicating pregnancy: Secondary | ICD-10-CM | POA: Insufficient documentation

## 2011-04-06 DIAGNOSIS — Z8751 Personal history of pre-term labor: Secondary | ICD-10-CM | POA: Insufficient documentation

## 2011-04-06 HISTORY — DX: Supervision of other high risk pregnancies, unspecified trimester: O09.899

## 2011-04-06 LAB — POCT URINALYSIS DIP (DEVICE)
Bilirubin Urine: NEGATIVE
Glucose, UA: 500 mg/dL — AB
Ketones, ur: NEGATIVE mg/dL
Nitrite: NEGATIVE
Protein, ur: NEGATIVE mg/dL
Specific Gravity, Urine: 1.025 (ref 1.005–1.030)
Urobilinogen, UA: 0.2 mg/dL (ref 0.0–1.0)
pH: 5 (ref 5.0–8.0)

## 2011-04-06 MED ORDER — INSULIN REGULAR HUMAN 100 UNIT/ML IJ SOLN: 16.0000 [IU] | Freq: Every day | INTRAMUSCULAR | Status: DC

## 2011-04-06 MED ORDER — INFLUENZA VIRUS VACC SPLIT PF IM SUSP: 0.5000 mL | Freq: Once | INTRAMUSCULAR | Status: DC

## 2011-04-06 MED ORDER — INSULIN NPH (HUMAN) (ISOPHANE) 100 UNIT/ML ~~LOC~~ SUSP: 32.0000 [IU] | Freq: Every day | SUBCUTANEOUS | Status: DC

## 2011-04-06 MED ORDER — INSULIN NPH (HUMAN) (ISOPHANE) 100 UNIT/ML ~~LOC~~ SUSP: 12.0000 [IU] | Freq: Every day | SUBCUTANEOUS | Status: DC

## 2011-04-06 MED ORDER — INSULIN REGULAR HUMAN 100 UNIT/ML IJ SOLN: 12.0000 [IU] | Freq: Every day | INTRAMUSCULAR | Status: DC

## 2011-04-06 NOTE — Progress Notes (Signed)
Diabetes Education:  Completed insulin instruction for mixing insulin and in insulin injection.  Mix AM: NPH 32 units and Regular 16 units before breakfast.  Before dinner: Regular 12 units and Bedtime: NPH 12 units.  Completed return demonstration using sterile saline (Lot [id number], Expiration 07/30/2012)  Provided insulin start kit [id number] Expiration: 05/02/2016.  Maggie Cleona Doubleday, RN, RD, CDE

## 2011-04-06 NOTE — Progress Notes (Signed)
Pt c/o yeast infection last Tuesday "had clear, blood discharge" has not had any bleeding since that day. Pulse 88

## 2011-04-06 NOTE — Progress Notes (Signed)
Fasting-117,223,112,110,123,99  2 hr break 132, 159; 2 hr lunch 78,181,100,125; 2 hr dinner 92,116,130,102.  Will start weight based insulin based on .8 x 91 kg.  Refuses BTL.  [place] today 52% growth, nml flid and cervical length.  Pt had blood tinged mucous last week with yeast infection.  Nothing currently.

## 2011-04-07 ENCOUNTER — Telehealth: Payer: Self-pay | Admitting: *Deleted

## 2011-04-07 DIAGNOSIS — O24919 Unspecified diabetes mellitus in pregnancy, unspecified trimester: Secondary | ICD-10-CM

## 2011-04-07 DIAGNOSIS — F1411 Cocaine abuse, in remission: Secondary | ICD-10-CM

## 2011-04-07 MED ORDER — INSULIN NPH (HUMAN) (ISOPHANE) 100 UNIT/ML ~~LOC~~ SUSP: 32.0000 [IU] | Freq: Every morning | SUBCUTANEOUS | Status: DC

## 2011-04-07 MED ORDER — INSULIN REGULAR HUMAN 100 UNIT/ML IJ SOLN: 16.0000 [IU] | Freq: Every day | INTRAMUSCULAR | Status: DC

## 2011-04-07 MED ORDER — INSULIN SYRINGES (DISPOSABLE) U-100 1 ML MISC: 3.0000 | Freq: Every day | Status: DC

## 2011-04-07 NOTE — Telephone Encounter (Signed)
Pt states the pharmacy has requested a change to her Rx for insulin so that insurance will pay. She also needs insulin syringes. Her pharmacy is [facility]. I contacted the pharmacy for additional information and made appropriate changes to the medication orders. I left message for pt that she can pick up her Rx's today.

## 2011-04-09 ENCOUNTER — Ambulatory Visit (INDEPENDENT_AMBULATORY_CARE_PROVIDER_SITE_OTHER): Payer: Medicaid Other | Admitting: *Deleted

## 2011-04-09 VITALS — BP 100/59 | Wt 206.7 lb

## 2011-04-09 DIAGNOSIS — O24919 Unspecified diabetes mellitus in pregnancy, unspecified trimester: Secondary | ICD-10-CM

## 2011-04-09 DIAGNOSIS — B373 Candidiasis of vulva and vagina: Secondary | ICD-10-CM

## 2011-04-09 MED ORDER — TERCONAZOLE 0.4 % VA CREA: 1.0000 | TOPICAL_CREAM | Freq: Every day | VAGINAL | Status: AC

## 2011-04-09 NOTE — Progress Notes (Signed)
P 81 for NST only today.c/o yeast infection, treated 2 weeks ago with diflucan x1, after antibiotics, c/o vaginal itching with thick white discharge, discussed with Wynelle Bourgeois, CNM, orders given

## 2011-04-10 DIAGNOSIS — L0390 Cellulitis, unspecified: Secondary | ICD-10-CM

## 2011-04-10 LAB — CBC
HCT: 37
Hemoglobin: 12.8
MCHC: 34.4
MCV: 85.6
Platelets: 221
RBC: 4.34
RDW: 13.2
RDW: 13.3
WBC: 13.4 — ABNORMAL HIGH

## 2011-04-10 LAB — POCT URINALYSIS DIP (DEVICE)
Bilirubin Urine: NEGATIVE
Bilirubin Urine: NEGATIVE
Glucose, UA: >=1000 — AB
Glucose, UA: >=1000 — AB
Ketones, ur: NEGATIVE
Ketones, ur: NEGATIVE
Nitrite: NEGATIVE
Nitrite: POSITIVE — AB
Operator id: 120861
pH: 5.5

## 2011-04-10 LAB — WET PREP, GENITAL: Clue Cells Wet Prep HPF POC: NONE SEEN

## 2011-04-10 LAB — URINALYSIS, ROUTINE W REFLEX MICROSCOPIC
Bilirubin Urine: NEGATIVE
Glucose, UA: 500 — AB
Ketones, ur: NEGATIVE
Nitrite: NEGATIVE
Protein, ur: NEGATIVE
pH: 6

## 2011-04-13 ENCOUNTER — Encounter: Payer: Medicaid Other | Admitting: Dietician

## 2011-04-13 ENCOUNTER — Other Ambulatory Visit: Payer: Self-pay | Admitting: Obstetrics and Gynecology

## 2011-04-13 ENCOUNTER — Ambulatory Visit (INDEPENDENT_AMBULATORY_CARE_PROVIDER_SITE_OTHER): Payer: Medicaid Other | Admitting: Physician Assistant

## 2011-04-13 VITALS — Temp 98.4°F | Wt 210.6 lb

## 2011-04-13 DIAGNOSIS — O09219 Supervision of pregnancy with history of pre-term labor, unspecified trimester: Secondary | ICD-10-CM

## 2011-04-13 DIAGNOSIS — O24919 Unspecified diabetes mellitus in pregnancy, unspecified trimester: Secondary | ICD-10-CM

## 2011-04-13 LAB — POCT URINALYSIS DIP (DEVICE)
Glucose, UA: 100 mg/dL — AB
Hgb urine dipstick: NEGATIVE
Hgb urine dipstick: NEGATIVE
Ketones, ur: NEGATIVE
Ketones, ur: NEGATIVE
Nitrite: NEGATIVE
Nitrite: NEGATIVE
Protein, ur: NEGATIVE mg/dL
Protein, ur: NEGATIVE
Protein, ur: NEGATIVE
Urobilinogen, UA: 0.2 mg/dL (ref 0.0–1.0)
pH: 6
pH: 6

## 2011-04-13 MED ORDER — INSULIN NPH (HUMAN) (ISOPHANE) 100 UNIT/ML ~~LOC~~ SUSP: 32.0000 [IU] | Freq: Every morning | SUBCUTANEOUS | Status: DC

## 2011-04-13 MED ORDER — FLUCONAZOLE 150 MG PO TABS: 150.0000 mg | ORAL_TABLET | Freq: Once | ORAL | Status: AC

## 2011-04-13 MED ORDER — INSULIN REGULAR HUMAN 100 UNIT/ML IJ SOLN: 16.0000 [IU] | Freq: Every day | INTRAMUSCULAR | Status: DC

## 2011-04-13 NOTE — Progress Notes (Signed)
04/13/2011 Diabetes Education:  Seen for review of new insulin dose and providing her a written list as she request.  NPH @ bedtime increased to 14 units.  Breakfast remains Regular:16 Units and NPH: 32  Units.  A lunch dose of Regular :14 units was added.  Pre-Dinner dose remains Regular: 12 units.  Maggie Chalmer Zheng, RN, RD, CDE.

## 2011-04-13 NOTE — Progress Notes (Signed)
FBS all > 90, 2hr PP ~ 140-150s. Will increase NPH @ hs to 14 units and add 10 units Novalin R to lunch. NST today reactive. Growth Korea scheduled. FU Thursday for NST only

## 2011-04-13 NOTE — Patient Instructions (Signed)
Pregnancy - Third Trimester The third trimester of pregnancy (the last 3 months) is a period of the most rapid growth for you and your baby. The baby approaches a length of 20 inches and a weight of 6 to 10 pounds. The baby is adding on fat and getting ready for life outside your body. While inside, babies have periods of sleeping and waking, suck their thumbs, and hiccups. You can often feel small contractions of the uterus. This is false labor. It is also called Braxton-Hicks contractions. This is like a practice for labor. The usual problems in this stage of pregnancy include more difficulty breathing, swelling of the hands and feet from water retention, and having to urinate more often because of the uterus and baby pressing on your bladder.  PRENATAL EXAMS  Blood work may continue to be done during prenatal exams. These tests are done to check on your health and the probable health of your baby. Blood work is used to follow your blood levels (hemoglobin). Anemia (low hemoglobin) is common during pregnancy. Iron and vitamins are given to help prevent this. You may also continue to be checked for diabetes. Some of the past blood tests may be done again.   The size of the uterus is measured during each visit. This makes sure your baby is growing properly according to your pregnancy dates.   Your blood pressure is checked every prenatal visit. This is to make sure you are not getting toxemia.   Your urine is checked every prenatal visit for infection, diabetes and protein.   Your weight is checked at each visit. This is done to make sure gains are happening at the suggested rate and that you and your baby are growing normally.   Sometimes, an ultrasound is performed to confirm the position and the proper growth and development of the baby. This is a test done that bounces harmless sound waves off the baby so your caregiver can more accurately determine due dates.   Discuss the type of pain  medication and anesthesia you will have during your labor and delivery.   Discuss the possibility and anesthesia if a Cesarean Section might be necessary.   Inform your caregiver if there is any mental or physical violence at home.  Sometimes, a specialized non-stress test, contraction stress test and biophysical profile are done to make sure the baby is not having a problem. Checking the amniotic fluid surrounding the baby is called an amniocentesis. The amniotic fluid is removed by sticking a needle into the belly (abdomen). This is sometimes done near the end of pregnancy if an early delivery is required. In this case, it is done to help make sure the baby's lungs are mature enough for the baby to live outside of the womb. If the lungs are not mature and it is unsafe to deliver the baby, an injection of cortisone medication is given to the mother 1 to 2 days before the delivery. This helps the baby's lungs mature and makes it safer to deliver the baby. CHANGES OCCURING IN THE THIRD TRIMESTER OF PREGNANCY Your body goes through many changes during pregnancy. They vary from person to person. Talk to your caregiver about changes you notice and are concerned about.  During the last trimester, you have probably had an increase in your appetite. It is normal to have cravings for certain foods. This varies from person to person and pregnancy to pregnancy.   You may begin to get stretch marks on your hips,   abdomen, and breasts. These are normal changes in the body during pregnancy. There are no exercises or medications to take which prevent this change.   Constipation may be treated with a stool softener or adding bulk to your diet. Drinking lots of fluids, fiber in vegetables, fruits, and whole grains are helpful.   Exercising is also helpful. If you have been very active up until your pregnancy, most of these activities can be continued during your pregnancy. If you have been less active, it is helpful  to start an exercise program such as walking. Consult your caregiver before starting exercise programs.   Avoid all smoking, alcohol, un-prescribed drugs, herbs and "street drugs" during your pregnancy. These chemicals affect the formation and growth of the baby. Avoid chemicals throughout the pregnancy to ensure the delivery of a healthy infant.   Backache, varicose veins and hemorrhoids may develop or get worse.   You will tire more easily in the third trimester, which is normal.   The baby's movements may be stronger and more often.   You may become short of breath easily.   Your belly button may stick out.   A yellow discharge may leak from your breasts called colostrum.   You may have a bloody mucus discharge. This usually occurs a few days to a week before labor begins.  HOME CARE INSTRUCTIONS  Keep your caregiver's appointments. Follow your caregiver's instructions regarding medication use, exercise, and diet.   During pregnancy, you are providing food for you and your baby. Continue to eat regular, well-balanced meals. Choose foods such as meat, fish, milk and other low fat dairy products, vegetables, fruits, and whole-grain breads and cereals. Your caregiver will tell you of the ideal weight gain.   A physical sexual relationship may be continued throughout pregnancy if there are no other problems such as early (premature) leaking of amniotic fluid from the membranes, vaginal bleeding, or belly (abdominal) pain.   Exercise regularly if there are no restrictions. Check with your caregiver if you are unsure of the safety of your exercises. Greater weight gain will occur in the last 2 trimesters of pregnancy. Exercising helps:   Control your weight.   Get you in shape for labor and delivery.   You lose weight after you deliver.   Rest a lot with legs elevated, or as needed for leg cramps or low back pain.   Wear a good support or jogging bra for breast tenderness during  pregnancy. This may help if worn during sleep. Pads or tissues may be used in the bra if you are leaking colostrum.   Do not use hot tubs, steam rooms, or saunas.   Wear your seat belt when driving. This protects you and your baby if you are in an accident.   Avoid raw meat, cat litter boxes and soil used by cats. These carry germs that can cause birth defects in the baby.   It is easier to loose urine during pregnancy. Tightening up and strengthening the pelvic muscles will help with this problem. You can practice stopping your urination while you are going to the bathroom. These are the same muscles you need to strengthen. It is also the muscles you would use if you were trying to stop from passing gas. You can practice tightening these muscles up 10 times a set and repeating this about 3 times per day. Once you know what muscles to tighten up, do not perform these exercises during urination. It is more likely to   cause an infection by backing up the urine.   Ask for help if you have financial, counseling or nutritional needs during pregnancy. Your caregiver will be able to offer counseling for these needs as well as refer you for other special needs.   Make a list of emergency phone numbers and have them available.   Plan on getting help from family or friends when you go home from the hospital.   Make a trial run to the hospital.   Take prenatal classes with the father to understand, practice and ask questions about the labor and delivery.   Prepare the baby's room/nursery.   Do not travel out of the city unless it is absolutely necessary and with the advice of your caregiver.   Wear only low or no heal shoes to have better balance and prevent falling.  MEDICATIONS AND DRUG USE IN PREGNANCY  Take prenatal vitamins as directed. The vitamin should contain 1 milligram of folic acid. Keep all vitamins out of reach of children. Only a couple vitamins or tablets containing iron may be fatal  to a baby or young child when ingested.   Avoid use of all medications, including herbs, over-the-counter medications, not prescribed or suggested by your caregiver. Only take over-the-counter or prescription medicines for pain, discomfort, or fever as directed by your caregiver. Do not use aspirin, ibuprofen (Motrin, Advil, Nuprin) or naproxen (Aleve) unless OK'd by your caregiver.   Let your caregiver also know about herbs you may be using.   Alcohol is related to a number of birth defects. This includes fetal alcohol syndrome. All alcohol, in any form, should be avoided completely. Smoking will cause low birth rate and premature babies.   Street/illegal drugs are very harmful to the baby. They are absolutely forbidden. A baby born to an addicted mother will be addicted at birth. The baby will go through the same withdrawal an adult does.  SEEK MEDICAL CARE IF  You have any concerns or worries during your pregnancy. It is better to call with your questions if you feel they cannot wait, rather than worry about them.  DECISIONS ABOUT CIRCUMCISION You may or may not know the sex of your baby. If you know your baby is a boy, it may be time to think about circumcision. Circumcision is the removal of the foreskin of the penis. This is the skin that covers the sensitive end of the penis. There is no proven medical need for this. Often this decision is made on what is popular at the time or based upon religious beliefs and social issues. You can discuss these issues with your caregiver or pediatrician. SEEK IMMEDIATE MEDICAL CARE IF:  An unexplained oral temperature above 100.5 develops, or as your caregiver suggests.   You have leaking of fluid from the vagina (birth canal). If leaking membranes are suspected, take your temperature and tell your caregiver of this when you call.   There is vaginal spotting, bleeding or passing clots. Tell your caregiver of the amount and how many pads are used.    You develop a bad smelling vaginal discharge with a change in the color from clear to white.   You develop vomiting that lasts more than 24 hours.   You develop chills or fever.   You develop shortness of breath.   You develop burning on urination.   You loose more than 2 pounds of weight or gain more than 2 pounds of weight or as suggested by your caregiver.     You notice sudden swelling of your face, hands, and feet or legs.   You develop belly (abdominal) pain. Round ligament discomfort is a common non-cancerous (benign) cause of abdominal pain in pregnancy. Your caregiver still must evaluate you.   You develop a severe headache that does not go away.   You develop visual problems, blurred or double vision.   If you have not felt your baby move for more than 1 hour. If you think the baby is not moving as much as usual, eat something with sugar in it and lie down on your left side for an hour. The baby should move at least 4 to 5 times per hour. Call right away if your baby moves less than that.   You fall, are in a car accident or any kind of trauma.   There is mental or physical violence at home.  Document Released: 06/09/2001 Document Re-Released: 04/12/2009 [employer] Patient Information 2011 [employer]. 

## 2011-04-14 LAB — POCT URINALYSIS DIP (DEVICE)
Bilirubin Urine: NEGATIVE
Hgb urine dipstick: NEGATIVE
Ketones, ur: NEGATIVE
Protein, ur: NEGATIVE
pH: 5.5

## 2011-04-15 LAB — POCT URINALYSIS DIP (DEVICE)
Bilirubin Urine: NEGATIVE
Hgb urine dipstick: NEGATIVE
Ketones, ur: NEGATIVE
Protein, ur: NEGATIVE
Specific Gravity, Urine: 1.015
pH: 7

## 2011-04-15 MED ORDER — GLUCOSE BLOOD VI STRP: ORAL_STRIP | Status: AC

## 2011-04-15 NOTE — Progress Notes (Signed)
Addended by: Jill Side on: 04/15/2011 04:38 PM   Modules accepted: Orders

## 2011-04-16 ENCOUNTER — Other Ambulatory Visit: Payer: Self-pay | Admitting: Physician Assistant

## 2011-04-16 ENCOUNTER — Ambulatory Visit (INDEPENDENT_AMBULATORY_CARE_PROVIDER_SITE_OTHER): Payer: Medicaid Other | Admitting: *Deleted

## 2011-04-16 VITALS — BP 108/59

## 2011-04-16 DIAGNOSIS — O24919 Unspecified diabetes mellitus in pregnancy, unspecified trimester: Secondary | ICD-10-CM

## 2011-04-16 DIAGNOSIS — O2340 Unspecified infection of urinary tract in pregnancy, unspecified trimester: Secondary | ICD-10-CM

## 2011-04-16 DIAGNOSIS — B951 Streptococcus, group B, as the cause of diseases classified elsewhere: Secondary | ICD-10-CM

## 2011-04-16 LAB — POCT URINALYSIS DIP (DEVICE)
Bilirubin Urine: NEGATIVE
Bilirubin Urine: NEGATIVE
Hgb urine dipstick: NEGATIVE
Hgb urine dipstick: NEGATIVE
Ketones, ur: NEGATIVE
Ketones, ur: NEGATIVE
Protein, ur: NEGATIVE
Specific Gravity, Urine: 1.01
Specific Gravity, Urine: 1.025
pH: 5.5
pH: 7

## 2011-04-16 LAB — CULTURE, OB URINE: Colony Count: 2000

## 2011-04-16 MED ORDER — CEPHALEXIN 500 MG PO CAPS: 500.0000 mg | ORAL_CAPSULE | Freq: Four times a day (QID) | ORAL | Status: DC

## 2011-04-16 NOTE — Progress Notes (Signed)
NST reviewed and reactive.  

## 2011-04-16 NOTE — Progress Notes (Signed)
Here for NST only 

## 2011-04-17 ENCOUNTER — Telehealth: Payer: Self-pay | Admitting: *Deleted

## 2011-04-17 NOTE — Telephone Encounter (Signed)
Message copied by Jill Side on Fri Apr 17, 2011  9:06 AM ------      Message from: Maylon Cos E      Created: Thu Apr 16, 2011  3:22 PM       Call pt with results. Rx sent to pharmacy

## 2011-04-17 NOTE — Telephone Encounter (Signed)
Called pt to notify of + UTI and need for Rx. I heard a message stating that the customer was not available and to try again later. Pt needs to be informed that her Rx has been sent to [facility] on [address]

## 2011-04-20 ENCOUNTER — Ambulatory Visit (INDEPENDENT_AMBULATORY_CARE_PROVIDER_SITE_OTHER): Payer: Medicaid Other | Admitting: Obstetrics & Gynecology

## 2011-04-20 DIAGNOSIS — O24919 Unspecified diabetes mellitus in pregnancy, unspecified trimester: Secondary | ICD-10-CM

## 2011-04-20 LAB — POCT URINALYSIS DIP (DEVICE)
Hgb urine dipstick: NEGATIVE
Ketones, ur: NEGATIVE mg/dL
Protein, ur: NEGATIVE mg/dL
Specific Gravity, Urine: 1.01 (ref 1.005–1.030)
Urobilinogen, UA: 0.2 mg/dL (ref 0.0–1.0)

## 2011-04-20 MED ORDER — INSULIN NPH (HUMAN) (ISOPHANE) 100 UNIT/ML ~~LOC~~ SUSP: 32.0000 [IU] | Freq: Every morning | SUBCUTANEOUS | Status: DC

## 2011-04-20 NOTE — Progress Notes (Signed)
U/S scheduled 04/23/11 at 830 am.

## 2011-04-20 NOTE — Telephone Encounter (Signed)
Pt was @ clinic to [facility] and stated that she was contacted by her pharmacy last week. She has been taking the antibiotic as prescribed.

## 2011-04-20 NOTE — Progress Notes (Signed)
Addended by: Jill Side on: 04/20/2011 10:07 AM   Modules accepted: Orders

## 2011-04-20 NOTE — Progress Notes (Signed)
FBS 105-123  PP116-200, 64-170, 121-145  Increase to 16 units N at bedtime

## 2011-04-20 NOTE — Progress Notes (Signed)
NST Reactive 04/09/11

## 2011-04-20 NOTE — Progress Notes (Signed)
Has some swelling in her feet. C/o very bad headaches daily.  Pelvic Pressure. In [place] on Friday and sugar dropped, went to [facility] and had contractions 5-7 apart for about 3.5 hours. Was told that cervix was soft but not dilated.

## 2011-04-21 ENCOUNTER — Encounter: Payer: Self-pay | Admitting: Obstetrics & Gynecology

## 2011-04-23 ENCOUNTER — Ambulatory Visit (INDEPENDENT_AMBULATORY_CARE_PROVIDER_SITE_OTHER): Payer: Medicaid Other | Admitting: *Deleted

## 2011-04-23 ENCOUNTER — Encounter (HOSPITAL_COMMUNITY): Payer: Self-pay | Admitting: *Deleted

## 2011-04-23 ENCOUNTER — Inpatient Hospital Stay (HOSPITAL_COMMUNITY)
Admission: AD | Admit: 2011-04-23 | Discharge: 2011-04-23 | Disposition: A | Discharge status: Home / Self Care | Payer: Medicaid Other | Source: Ambulatory Visit | Attending: Obstetrics and Gynecology | Admitting: Obstetrics and Gynecology

## 2011-04-23 ENCOUNTER — Ambulatory Visit (HOSPITAL_COMMUNITY)
Admission: RE | Admit: 2011-04-23 | Discharge: 2011-04-23 | Disposition: A | Discharge status: Home / Self Care | Payer: Medicaid Other | Source: Ambulatory Visit | Attending: Obstetrics & Gynecology | Admitting: Obstetrics & Gynecology

## 2011-04-23 VITALS — BP 107/55 | Wt 210.0 lb

## 2011-04-23 DIAGNOSIS — Z3480 Encounter for supervision of other normal pregnancy, unspecified trimester: Secondary | ICD-10-CM

## 2011-04-23 DIAGNOSIS — F1411 Cocaine abuse, in remission: Secondary | ICD-10-CM

## 2011-04-23 DIAGNOSIS — L0390 Cellulitis, unspecified: Secondary | ICD-10-CM

## 2011-04-23 DIAGNOSIS — O24919 Unspecified diabetes mellitus in pregnancy, unspecified trimester: Secondary | ICD-10-CM

## 2011-04-23 DIAGNOSIS — Z3689 Encounter for other specified antenatal screening: Secondary | ICD-10-CM

## 2011-04-23 DIAGNOSIS — O34219 Maternal care for unspecified type scar from previous cesarean delivery: Secondary | ICD-10-CM | POA: Insufficient documentation

## 2011-04-23 DIAGNOSIS — O368390 Maternal care for abnormalities of the fetal heart rate or rhythm, unspecified trimester, not applicable or unspecified: Secondary | ICD-10-CM | POA: Insufficient documentation

## 2011-04-23 DIAGNOSIS — O09529 Supervision of elderly multigravida, unspecified trimester: Secondary | ICD-10-CM | POA: Insufficient documentation

## 2011-04-23 DIAGNOSIS — O09299 Supervision of pregnancy with other poor reproductive or obstetric history, unspecified trimester: Secondary | ICD-10-CM | POA: Insufficient documentation

## 2011-04-23 DIAGNOSIS — L0291 Cutaneous abscess, unspecified: Secondary | ICD-10-CM

## 2011-04-23 DIAGNOSIS — O0930 Supervision of pregnancy with insufficient antenatal care, unspecified trimester: Secondary | ICD-10-CM | POA: Insufficient documentation

## 2011-04-23 LAB — GLUCOSE, CAPILLARY: Glucose-Capillary: 129 mg/dL — ABNORMAL HIGH (ref 70–99)

## 2011-04-23 MED ORDER — INSULIN REGULAR HUMAN 100 UNIT/ML IJ SOLN
10.0000 [IU] | Freq: Once | INTRAMUSCULAR | Status: AC
  Administered 2011-04-23: 10 [IU] via SUBCUTANEOUS

## 2011-04-23 NOTE — Progress Notes (Signed)
NST on 10/25 is reactive and reassuring.

## 2011-04-23 NOTE — Progress Notes (Signed)
Pt sent up from clinic due to decel in office, reports decreased fetal movement x last couple weeks.  Denies any bleeding or leaking of fluid.

## 2011-04-23 NOTE — ED Provider Notes (Signed)
History     Chief Complaint  Patient presents with  . Non-stress Test   HPI  Pt sent up from clinic due to decel in office, reports decreased fetal movement x last couple weeks. Denies any bleeding or leaking of fluid.    Past Medical History  Diagnosis Date  . Preterm labor   . Cellulitis June 2012    right knee  . 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 Gestational diabetes     "with two pregnancied 1996/1998"  . Diabetes mellitus, antepartum     Past Surgical History  Procedure Date  . Cesarean section   . Urinary stent 2002    No family history on file.  History  Substance Use Topics  . Smoking status: Current Everyday Smoker -- 0.1 packs/day    Types: Cigarettes  . Smokeless tobacco: Never Used  . Alcohol Use: No    Allergies:  Allergies  Allergen Reactions  . Codeine Hives    Prescriptions prior to admission  Medication Sig Dispense Refill  . cephALEXin (KEFLEX) 500 MG capsule Take 500 mg by mouth 4 (four) times daily.        . insulin NPH (HUMULIN N,NOVOLIN N) 100 UNIT/ML injection Inject 32 Units into the skin AC breakfast. Also inject 16 units into the skin @@ bedtime daily       . insulin regular (HUMULIN R,NOVOLIN R) 100 units/mL injection Inject 16 Units into the skin daily with breakfast. Also inject 12 units into the skin daily with dinner Inject 10 units SQ with lunch      . prenatal vitamin w/FE, FA (PRENATAL 1 + 1) 27-1 MG TABS Take 1 tablet by mouth daily.        Marland Kitchen DISCONTD: cephALEXin (KEFLEX) 500 MG capsule Take 1 capsule (500 mg total) by mouth 4 (four) times daily.  28 capsule  0  . DISCONTD: insulin NPH (NOVOLIN N) 100 UNIT/ML injection Inject 32 Units into the skin AC breakfast. Also inject 16 units into the skin @ bedtime daily  10 mL  12  . DISCONTD: insulin regular (HUMULIN R) 100 units/mL injection Inject 16 Units into the skin daily with breakfast. Also inject 12 units into the skin daily with dinner Inject  10 units SQ with lunch  10 mL  12  . ACCU-CHEK FASTCLIX LANCETS MISC 1 Device by Does not apply route 4 (four) times daily.  120 each  11  . glucose blood (ACCU-CHEK SMARTVIEW) test strip Use as instructed- check blood sugar 4 times daily  100 each  12  . Insulin Syringes, Disposable, U-100 1 ML MISC 3 Syringes by Does not apply route daily.  100 each  12  . nystatin (MYCOSTATIN) cream Apply 1 application topically as needed.       . Prenatal Vit-Fe Fumarate-FA (PRENATAL MULTIVITAMIN) 60-1 MG tablet Take 1 tablet by mouth daily.  30 tablet  11  . Prenatal Vit-Fe Psac Cmplx-FA (PRENATAL MULTIVITAMIN) 60-1 MG tablet Take 1 tablet by mouth daily.          ROS All pertinent info included in the HPI  Physical Exam   Blood pressure 114/55, pulse 91, temperature 97.2 F (36.2 C), temperature source Oral, resp. rate 16, height 5\' 7"  (1.702 m), weight 96.344 kg (212 lb 6.4 oz), last menstrual period 08/23/2010, SpO2 97.00%.  Physical Exam  Constitutional: She is oriented to person, place, and time. She appears well-developed and well-nourished.  HENT:  Head:  Normocephalic.  Neck: Normal range of motion. Neck supple.  Cardiovascular: Normal rate, regular rhythm and normal heart sounds.   Respiratory: Effort normal and breath sounds normal.  Genitourinary: Vaginal discharge:    Neurological: She is alert and oriented to person, place, and time.  Skin: Skin is warm and dry.  FHR 120's, +accels, no decels Toco - none  MAU Course  Procedures  Consulted with Dr. Jolayne Panther - pt may be discharged home if reactive NST after 1 hr; obtain BPP in nonreactive.  Assessment and Plan  Fetal Well Being - Category I  Plan: DC to home [facility] scheduled appointment.  [facility] 04/23/2011, 12:51 PM

## 2011-04-23 NOTE — Progress Notes (Signed)
P = 86       NST only today. Pt had growth [place] today

## 2011-04-23 NOTE — Progress Notes (Signed)
Patient was in the [facility] for a NST and had deceleration. Was sent to MAU for evaluation.

## 2011-04-27 ENCOUNTER — Ambulatory Visit (INDEPENDENT_AMBULATORY_CARE_PROVIDER_SITE_OTHER): Payer: Medicaid Other | Admitting: Family Medicine

## 2011-04-27 DIAGNOSIS — B373 Candidiasis of vulva and vagina: Secondary | ICD-10-CM

## 2011-04-27 DIAGNOSIS — O24919 Unspecified diabetes mellitus in pregnancy, unspecified trimester: Secondary | ICD-10-CM

## 2011-04-27 DIAGNOSIS — O09219 Supervision of pregnancy with history of pre-term labor, unspecified trimester: Secondary | ICD-10-CM

## 2011-04-27 LAB — POCT URINALYSIS DIP (DEVICE)
Glucose, UA: 500 mg/dL — AB
Ketones, ur: NEGATIVE mg/dL
Protein, ur: NEGATIVE mg/dL
Specific Gravity, Urine: 1.015 (ref 1.005–1.030)
Urobilinogen, UA: 0.2 mg/dL (ref 0.0–1.0)

## 2011-04-27 MED ORDER — INSULIN REGULAR HUMAN 100 UNIT/ML IJ SOLN: INTRAMUSCULAR | Status: DC

## 2011-04-27 MED ORDER — "INSULIN SYRINGE-NEEDLE U-100 30G X 3/8"" 1 ML MISC": 1.0000 | Freq: Three times a day (TID) | Status: DC

## 2011-04-27 MED ORDER — FLUCONAZOLE 150 MG PO TABS: 150.0000 mg | ORAL_TABLET | Freq: Once | ORAL | Status: AC

## 2011-04-27 MED ORDER — INSULIN NPH (HUMAN) (ISOPHANE) 100 UNIT/ML ~~LOC~~ SUSP: SUBCUTANEOUS | Status: DC

## 2011-04-27 NOTE — Progress Notes (Signed)
U/S scheduled on November 19,2012 at 945 am.

## 2011-04-27 NOTE — Progress Notes (Signed)
Subjective:    Caitlin Faulkner is a 35 y.o. female being seen today for her obstetrical visit. She is at [redacted]w[redacted]d gestation. Patient reports no bleeding, no contractions and uncontrolled CBGs. States she has yeast infection now that she has been on abx for GBS bacteruia. Fetal movement: normal.  Fasting CBGs: [id number] 2 hrs post breakfast: (patient admits really more like 4 hours after) [phone] 2 hrs post lunch: (patient admits really more like 3 hours after) 134-140-153-151-140 2 hrs post dinner: [id number] Menstrual History: OB History    Grav Para Term Preterm Abortions TAB SAB Ect Mult Living   9 6 3 3 2 1  0 1 0 5       Objective:    BP 108/71  Temp 97.1 F (36.2 C)  Wt 212 lb 12.8 oz (96.525 kg)  LMP 08/23/2010 FHT:  144 BPM  Uterine Size: 35.5 cm  Presentation: cephalic    NST reviewed: FHTs baseline 140's, accels w/out decels; Category 1 Tracing (paper NST)  Assessment:    Pregnancy 35 and 2/7 weeks   Plan:  GBS Bacteruria. Patient c/o yeast infection. Will prescribe Diflucan 150mg  once. Cont twice weekly NST, see provider in 1 week or prn. Discussed labor precautions (pt has not been taking 17-P this pregnancy). Desires to bottle feed, reports she signed papers for BTL post delivery. Plan for IOL at 39 weeks, schedule f/u growth [place] at 38 wks. Increase NPH to 18 units at bedtime, cont NPH 32 units in AM Increase breakfast Novolin to 20 units, lunch to 12 units, and dinner to 14 units. Larger syringes also ordered.

## 2011-04-27 NOTE — Progress Notes (Signed)
Pulse 90. Pt states when laying on sides hands will swell. Pt also states having vaginal discharge that is white.

## 2011-04-27 NOTE — ED Provider Notes (Signed)
Agree with above note.  Caitlin Faulkner 04/27/2011 11:52 AM

## 2011-04-27 NOTE — Patient Instructions (Signed)
Pregnancy - Third Trimester The third trimester of pregnancy (the last 3 months) is a period of the most rapid growth for you and your baby. The baby approaches a length of 20 inches and a weight of 6 to 10 pounds. The baby is adding on fat and getting ready for life outside your body. While inside, babies have periods of sleeping and waking, suck their thumbs, and hiccups. You can often feel small contractions of the uterus. This is false labor. It is also called Braxton-Hicks contractions. This is like a practice for labor. The usual problems in this stage of pregnancy include more difficulty breathing, swelling of the hands and feet from water retention, and having to urinate more often because of the uterus and baby pressing on your bladder.  PRENATAL EXAMS  Blood work may continue to be done during prenatal exams. These tests are done to check on your health and the probable health of your baby. Blood work is used to follow your blood levels (hemoglobin). Anemia (low hemoglobin) is common during pregnancy. Iron and vitamins are given to help prevent this. You may also continue to be checked for diabetes. Some of the past blood tests may be done again.   The size of the uterus is measured during each visit. This makes sure your baby is growing properly according to your pregnancy dates.   Your blood pressure is checked every prenatal visit. This is to make sure you are not getting toxemia.   Your urine is checked every prenatal visit for infection, diabetes and protein.   Your weight is checked at each visit. This is done to make sure gains are happening at the suggested rate and that you and your baby are growing normally.   Sometimes, an ultrasound is performed to confirm the position and the proper growth and development of the baby. This is a test done that bounces harmless sound waves off the baby so your caregiver can more accurately determine due dates.   Discuss the type of pain  medication and anesthesia you will have during your labor and delivery.   Discuss the possibility and anesthesia if a Cesarean Section might be necessary.   Inform your caregiver if there is any mental or physical violence at home.  Sometimes, a specialized non-stress test, contraction stress test and biophysical profile are done to make sure the baby is not having a problem. Checking the amniotic fluid surrounding the baby is called an amniocentesis. The amniotic fluid is removed by sticking a needle into the belly (abdomen). This is sometimes done near the end of pregnancy if an early delivery is required. In this case, it is done to help make sure the baby's lungs are mature enough for the baby to live outside of the womb. If the lungs are not mature and it is unsafe to deliver the baby, an injection of cortisone medication is given to the mother 1 to 2 days before the delivery. This helps the baby's lungs mature and makes it safer to deliver the baby. CHANGES OCCURING IN THE THIRD TRIMESTER OF PREGNANCY Your body goes through many changes during pregnancy. They vary from person to person. Talk to your caregiver about changes you notice and are concerned about.  During the last trimester, you have probably had an increase in your appetite. It is normal to have cravings for certain foods. This varies from person to person and pregnancy to pregnancy.   You may begin to get stretch marks on your hips,   abdomen, and breasts. These are normal changes in the body during pregnancy. There are no exercises or medications to take which prevent this change.   Constipation may be treated with a stool softener or adding bulk to your diet. Drinking lots of fluids, fiber in vegetables, fruits, and whole grains are helpful.   Exercising is also helpful. If you have been very active up until your pregnancy, most of these activities can be continued during your pregnancy. If you have been less active, it is helpful  to start an exercise program such as walking. Consult your caregiver before starting exercise programs.   Avoid all smoking, alcohol, un-prescribed drugs, herbs and "street drugs" during your pregnancy. These chemicals affect the formation and growth of the baby. Avoid chemicals throughout the pregnancy to ensure the delivery of a healthy infant.   Backache, varicose veins and hemorrhoids may develop or get worse.   You will tire more easily in the third trimester, which is normal.   The baby's movements may be stronger and more often.   You may become short of breath easily.   Your belly button may stick out.   A yellow discharge may leak from your breasts called colostrum.   You may have a bloody mucus discharge. This usually occurs a few days to a week before labor begins.  HOME CARE INSTRUCTIONS   Keep your caregiver's appointments. Follow your caregiver's instructions regarding medication use, exercise, and diet.   During pregnancy, you are providing food for you and your baby. Continue to eat regular, well-balanced meals. Choose foods such as meat, fish, milk and other low fat dairy products, vegetables, fruits, and whole-grain breads and cereals. Your caregiver will tell you of the ideal weight gain.   A physical sexual relationship may be continued throughout pregnancy if there are no other problems such as early (premature) leaking of amniotic fluid from the membranes, vaginal bleeding, or belly (abdominal) pain.   Exercise regularly if there are no restrictions. Check with your caregiver if you are unsure of the safety of your exercises. Greater weight gain will occur in the last 2 trimesters of pregnancy. Exercising helps:   Control your weight.   Get you in shape for labor and delivery.   You lose weight after you deliver.   Rest a lot with legs elevated, or as needed for leg cramps or low back pain.   Wear a good support or jogging bra for breast tenderness during  pregnancy. This may help if worn during sleep. Pads or tissues may be used in the bra if you are leaking colostrum.   Do not use hot tubs, steam rooms, or saunas.   Wear your seat belt when driving. This protects you and your baby if you are in an accident.   Avoid raw meat, cat litter boxes and soil used by cats. These carry germs that can cause birth defects in the baby.   It is easier to loose urine during pregnancy. Tightening up and strengthening the pelvic muscles will help with this problem. You can practice stopping your urination while you are going to the bathroom. These are the same muscles you need to strengthen. It is also the muscles you would use if you were trying to stop from passing gas. You can practice tightening these muscles up 10 times a set and repeating this about 3 times per day. Once you know what muscles to tighten up, do not perform these exercises during urination. It is more likely   to cause an infection by backing up the urine.   Ask for help if you have financial, counseling or nutritional needs during pregnancy. Your caregiver will be able to offer counseling for these needs as well as refer you for other special needs.   Make a list of emergency phone numbers and have them available.   Plan on getting help from family or friends when you go home from the hospital.   Make a trial run to the hospital.   Take prenatal classes with the father to understand, practice and ask questions about the labor and delivery.   Prepare the baby's room/nursery.   Do not travel out of the city unless it is absolutely necessary and with the advice of your caregiver.   Wear only low or no heal shoes to have better balance and prevent falling.  MEDICATIONS AND DRUG USE IN PREGNANCY  Take prenatal vitamins as directed. The vitamin should contain 1 milligram of folic acid. Keep all vitamins out of reach of children. Only a couple vitamins or tablets containing iron may be fatal  to a baby or young child when ingested.   Avoid use of all medications, including herbs, over-the-counter medications, not prescribed or suggested by your caregiver. Only take over-the-counter or prescription medicines for pain, discomfort, or fever as directed by your caregiver. Do not use aspirin, ibuprofen (Motrin, Advil, Nuprin) or naproxen (Aleve) unless OK'd by your caregiver.   Let your caregiver also know about herbs you may be using.   Alcohol is related to a number of birth defects. This includes fetal alcohol syndrome. All alcohol, in any form, should be avoided completely. Smoking will cause low birth rate and premature babies.   Street/illegal drugs are very harmful to the baby. They are absolutely forbidden. A baby born to an addicted mother will be addicted at birth. The baby will go through the same withdrawal an adult does.  SEEK MEDICAL CARE IF: You have any concerns or worries during your pregnancy. It is better to call with your questions if you feel they cannot wait, rather than worry about them. DECISIONS ABOUT CIRCUMCISION You may or may not know the sex of your baby. If you know your baby is a boy, it may be time to think about circumcision. Circumcision is the removal of the foreskin of the penis. This is the skin that covers the sensitive end of the penis. There is no proven medical need for this. Often this decision is made on what is popular at the time or based upon religious beliefs and social issues. You can discuss these issues with your caregiver or pediatrician. SEEK IMMEDIATE MEDICAL CARE IF:   An unexplained oral temperature above 102 F (38.9 C) develops, or as your caregiver suggests.   You have leaking of fluid from the vagina (birth canal). If leaking membranes are suspected, take your temperature and tell your caregiver of this when you call.   There is vaginal spotting, bleeding or passing clots. Tell your caregiver of the amount and how many pads are  used.   You develop a bad smelling vaginal discharge with a change in the color from clear to white.   You develop vomiting that lasts more than 24 hours.   You develop chills or fever.   You develop shortness of breath.   You develop burning on urination.   You loose more than 2 pounds of weight or gain more than 2 pounds of weight or as suggested by your   caregiver.   You notice sudden swelling of your face, hands, and feet or legs.   You develop belly (abdominal) pain. Round ligament discomfort is a common non-cancerous (benign) cause of abdominal pain in pregnancy. Your caregiver still must evaluate you.   You develop a severe headache that does not go away.   You develop visual problems, blurred or double vision.   If you have not felt your baby move for more than 1 hour. If you think the baby is not moving as much as usual, eat something with sugar in it and lie down on your left side for an hour. The baby should move at least 4 to 5 times per hour. Call right away if your baby moves less than that.   You fall, are in a car accident or any kind of trauma.   There is mental or physical violence at home.  Document Released: 06/09/2001 Document Revised: 02/25/2011 Document Reviewed: 12/12/2008 [employer] Patient Information 2012 [employer]. 

## 2011-04-30 ENCOUNTER — Other Ambulatory Visit: Payer: Medicaid Other

## 2011-05-04 ENCOUNTER — Ambulatory Visit (INDEPENDENT_AMBULATORY_CARE_PROVIDER_SITE_OTHER): Payer: Medicaid Other | Admitting: Obstetrics & Gynecology

## 2011-05-04 VITALS — BP 114/78 | Temp 97.0°F | Wt 210.3 lb

## 2011-05-04 DIAGNOSIS — Z22330 Carrier of Group B streptococcus: Secondary | ICD-10-CM

## 2011-05-04 DIAGNOSIS — O24919 Unspecified diabetes mellitus in pregnancy, unspecified trimester: Secondary | ICD-10-CM

## 2011-05-04 HISTORY — DX: Carrier of group B Streptococcus: Z22.330

## 2011-05-04 LAB — POCT URINALYSIS DIP (DEVICE)
Bilirubin Urine: NEGATIVE
Glucose, UA: 500 mg/dL — AB
Hgb urine dipstick: NEGATIVE
Nitrite: NEGATIVE
Specific Gravity, Urine: 1.015 (ref 1.005–1.030)
pH: 5.5 (ref 5.0–8.0)

## 2011-05-04 NOTE — Progress Notes (Signed)
P=93, c/o rectal pressure this weekend, c/o a lot of pelvic pain/contractions? Since Friday,was "so bad Friday night I almost came to hospital"

## 2011-05-04 NOTE — Progress Notes (Signed)
Increaes NPH in am to 35.  Increase regular insulin in the morning to 22.  Gc/Chlam done today.  Pt already GBS positive.

## 2011-05-05 LAB — GC/CHLAMYDIA PROBE AMP, GENITAL
Chlamydia, DNA Probe: NEGATIVE
GC Probe Amp, Genital: NEGATIVE

## 2011-05-07 ENCOUNTER — Other Ambulatory Visit: Payer: Medicaid Other

## 2011-05-07 ENCOUNTER — Telehealth: Payer: Self-pay | Admitting: *Deleted

## 2011-05-07 NOTE — Telephone Encounter (Signed)
Pt left message stating that her insulin dosages were changed on 11/5 and she forgot to write down the info. She would like a call back with the new dosage information. I returned pt call now and told her the new changes are:  Am regular insulin 22 units and am NPH insulin 35 units. All othe dosages remain the same. Pt voiced understanding.

## 2011-05-11 ENCOUNTER — Ambulatory Visit (INDEPENDENT_AMBULATORY_CARE_PROVIDER_SITE_OTHER): Payer: Medicaid Other | Admitting: Obstetrics & Gynecology

## 2011-05-11 VITALS — BP 128/76 | Temp 97.2°F | Wt 210.8 lb

## 2011-05-11 DIAGNOSIS — O09529 Supervision of elderly multigravida, unspecified trimester: Secondary | ICD-10-CM

## 2011-05-11 DIAGNOSIS — O24919 Unspecified diabetes mellitus in pregnancy, unspecified trimester: Secondary | ICD-10-CM

## 2011-05-11 LAB — POCT URINALYSIS DIP (DEVICE)
Bilirubin Urine: NEGATIVE
Hgb urine dipstick: NEGATIVE
Ketones, ur: NEGATIVE mg/dL
Protein, ur: 30 mg/dL — AB
Specific Gravity, Urine: >=1.03 (ref 1.005–1.030)
pH: 6 (ref 5.0–8.0)

## 2011-05-11 MED ORDER — PANTOPRAZOLE SODIUM 40 MG PO TBEC: 40.0000 mg | DELAYED_RELEASE_TABLET | Freq: Every day | ORAL | Status: DC

## 2011-05-11 MED ORDER — INSULIN REGULAR HUMAN 100 UNIT/ML IJ SOLN: INTRAMUSCULAR | Status: DC

## 2011-05-11 MED ORDER — FLUCONAZOLE 100 MG PO TABS: 100.0000 mg | ORAL_TABLET | Freq: Every day | ORAL | Status: AC

## 2011-05-11 MED ORDER — INSULIN NPH (HUMAN) (ISOPHANE) 100 UNIT/ML ~~LOC~~ SUSP: SUBCUTANEOUS | Status: DC

## 2011-05-11 NOTE — Progress Notes (Signed)
IOL scheduled 05/23/11 @ 0700.  Pt reports problems with indigestion. Also reports sx of vaginal yeast- itching, burning, thick white d/c.

## 2011-05-11 NOTE — Progress Notes (Signed)
Pulse: 85

## 2011-05-11 NOTE — Progress Notes (Signed)
Addended by: Jill Side on: 05/11/2011 01:38 PM   Modules accepted: Orders

## 2011-05-11 NOTE — Progress Notes (Signed)
Sx of yeast, needs Diflucan   FBS 97-110  PP 113,157,171  Inc NPH to 20 units bedtime, R at supper to 16 units  NST today reactive  Will add Protonix for heartburn  [place] on 11/19 for growth

## 2011-05-12 ENCOUNTER — Telehealth (HOSPITAL_COMMUNITY): Payer: Self-pay | Admitting: *Deleted

## 2011-05-12 ENCOUNTER — Encounter (HOSPITAL_COMMUNITY): Payer: Self-pay | Admitting: *Deleted

## 2011-05-12 MED ORDER — INSULIN REGULAR HUMAN 100 UNIT/ML IJ SOLN: INTRAMUSCULAR | Status: DC

## 2011-05-12 NOTE — Progress Notes (Signed)
Addended by: Marchelle Folks L on: 05/12/2011 11:01 AM   Modules accepted: Orders

## 2011-05-12 NOTE — Telephone Encounter (Signed)
Preadmission screen  

## 2011-05-14 ENCOUNTER — Other Ambulatory Visit: Payer: Medicaid Other

## 2011-05-18 ENCOUNTER — Ambulatory Visit (HOSPITAL_COMMUNITY)
Admission: RE | Admit: 2011-05-18 | Discharge: 2011-05-18 | Disposition: A | Discharge status: Home / Self Care | Payer: Medicaid Other | Source: Ambulatory Visit | Attending: Physician Assistant | Admitting: Physician Assistant

## 2011-05-18 ENCOUNTER — Ambulatory Visit (INDEPENDENT_AMBULATORY_CARE_PROVIDER_SITE_OTHER): Payer: Medicaid Other | Admitting: Physician Assistant

## 2011-05-18 VITALS — BP 112/74 | Temp 96.9°F | Wt 215.4 lb

## 2011-05-18 DIAGNOSIS — O24919 Unspecified diabetes mellitus in pregnancy, unspecified trimester: Secondary | ICD-10-CM

## 2011-05-18 DIAGNOSIS — O09529 Supervision of elderly multigravida, unspecified trimester: Secondary | ICD-10-CM | POA: Insufficient documentation

## 2011-05-18 DIAGNOSIS — O34219 Maternal care for unspecified type scar from previous cesarean delivery: Secondary | ICD-10-CM | POA: Insufficient documentation

## 2011-05-18 DIAGNOSIS — O09299 Supervision of pregnancy with other poor reproductive or obstetric history, unspecified trimester: Secondary | ICD-10-CM | POA: Insufficient documentation

## 2011-05-18 LAB — POCT URINALYSIS DIP (DEVICE)
Bilirubin Urine: NEGATIVE
Glucose, UA: 500 mg/dL — AB
Hgb urine dipstick: NEGATIVE
Nitrite: NEGATIVE

## 2011-05-18 NOTE — Progress Notes (Signed)
Pt has a trickling of fluid from time to time.

## 2011-05-18 NOTE — Progress Notes (Signed)
Will increase NPH to 20 units at hs, all FBS> 90 (b/t 90-100) IOL Sunday. Attempted to sweep membranes per pt request.

## 2011-05-18 NOTE — Patient Instructions (Signed)
Labor Induction A pregnant woman usually goes into labor spontaneously before the birth of her baby. Most babies are born between 37 and 42 weeks of the pregnancy. When this does not happen, caregivers may use medication or other methods to bring on (induce) labor. Labor induction causes a pregnant woman's uterus to contract, the cervix to open (dilate) and thin out (efface) to prepare for the vaginal birth of her baby. Several methods of labor induction may be used such as:  Massaging the nipple and areola of the breasts (nipple stimulation).   Prostaglandin medication used orally or as a vaginal cream.   Striping of membranes (your caregiver inserts a finger between the cervix and membranes around the baby's head) causes the body to produce prostaglandins that soften the cervix and cause the uterus to contract.   Rupture of the water bag (amniotomy).   Oxytocin by IV.   Special dilators placed into the cervical canal that causes the cervix to soften and open.   Mechanical devices to stretch open the cervix such as, a dilated foley catheter.  Whether your labor will be induced depends on the condition of you and your baby, how far along you are, are the baby's lung maturity, the condition of the cervix, the way the baby is lying, and other factors. Usually, labor is not induced before 39 weeks of the pregnancy unless there is a problem with the baby or mother, and it becomes necessary to induce labor. REASONS LABOR SHOULD BE INDUCED  The health of the baby or mother has become at risk.   The pregnancy is overdue by 2 weeks or more.   Your water breaks (premature rupture of membranes), the baby's lungs are mature, and labor does not start on its own.   You develop high blood pressure (toxemia of pregnancy).   You develop an infection in your uterus.   You have diabetes or other serious medical illness.   Amniotic fluid amounts are small around the baby.   Your placenta begins to  separate from the inner wall of the uterus before the baby is born (placental abruption). This condition may cause you to have an emergency Cesarean delivery.   You have fetal death.   A social induction is also known as an induction for convenience. Most of the time, labor is induced for sound medical reasons. Sometimes, it is done as a convenience. Living a long way from the hospital or having a history of very rapid labors may be reasons the mother may want to induce delivery.  REASONS LABOR SHOULD NOT BE INDUCED  You have had previous surgeries on your uterus. This is especially true if the surgeries went into the inside lining and cavity of the uterus. This gives an added risk for rupturing the uterus.   You have placenta previa. This means your placenta lies very low in the uterus and blocks the opening (cervix) for the baby to get out.   Your baby is not in a head down position. For example, if your baby lies across your uterus (transverse) instead of head first.   If the umbilical cord drops down into the birth canal in front of your baby. This could cut off the baby's blood supply and oxygen to the baby.  RISKS AND COMPLICATIONS Problems seldom occur with labor induction, but there can be some complications. Some of the risks of induction include:  Change in fetal heart rate (too high, too low or irradic).   Increased risk of   a premature baby, even if you think your baby is term.   Increased risk of fetal distress. This means your baby gets into problems during induction. This can be caused by the umbilical cord coming out in front of the baby or is being squeezed.   Increased risk of infection to mother and baby.   Increased chance of having a Cesarean delivery. This is an operation on your belly (abdomen) to remove the baby.   Strong contractions can lead to abruption. This is a separating of the placenta from the uterus.   Uterine rupture, especially if you had a previous  Cesarean or surgery on your uterus.  When labor is induced because of medical problems, other risks may be present. Induced labor may lead to:  Increased use of medications for pain relief.   Other interventions.  When induction is needed for medical reasons, the benefits of induction may outweigh the risks. PROCEDURE It can sometimes take up to 2 or 3 days to induce labor. It usually takes less time. It takes longer when you are induced early in the pregnancy and for first pregnancies.  Before coming to the hospital for an induction:  Do not eat much before you come to the hospital (for at least 8 hours).   Do not eat after midnight if you are going to be induced the next morning.   Be aware that medications for labor induction can upset your stomach.   Let your caregiver know if you need medications for pain.  HOME CARE INSTRUCTIONS If you have been induced in your caregiver's office to start labor, and are allowed to go home, follow the instructions given to you by your care giver. SEEK IMMEDIATE MEDICAL CARE IF:  You develop any kind of vaginal bleeding.   You develop contractions that are severe and continuous.   You feel faint or feel light headed.   You do not develop contractions within the time your caregiver suggests you should.   You begin to run a temperature of 100 F (37.8 C) or develop chills.   You no longer feel the normal fetal movement.  Document Released: 11/04/2006 Document Revised: 02/25/2011 Document Reviewed: 02/22/2009 [employer] Patient Information 2012 [employer]. 

## 2011-05-18 NOTE — Progress Notes (Signed)
[place] growth done today.  IOL scheduled for 05/23/11 @ 0700

## 2011-05-20 ENCOUNTER — Other Ambulatory Visit: Payer: Medicaid Other

## 2011-05-23 ENCOUNTER — Encounter (HOSPITAL_COMMUNITY): Payer: Self-pay

## 2011-05-23 ENCOUNTER — Encounter (HOSPITAL_COMMUNITY): Payer: Self-pay | Admitting: Anesthesiology

## 2011-05-23 ENCOUNTER — Inpatient Hospital Stay (HOSPITAL_COMMUNITY): Payer: Medicaid Other | Admitting: Anesthesiology

## 2011-05-23 ENCOUNTER — Inpatient Hospital Stay (HOSPITAL_COMMUNITY)
Admission: RE | Admit: 2011-05-23 | Discharge: 2011-05-26 | DRG: 767 | Disposition: A | Discharge status: Home / Self Care | Payer: Medicaid Other | Source: Ambulatory Visit | Attending: Obstetrics & Gynecology | Admitting: Obstetrics & Gynecology

## 2011-05-23 DIAGNOSIS — O34219 Maternal care for unspecified type scar from previous cesarean delivery: Secondary | ICD-10-CM | POA: Diagnosis present

## 2011-05-23 DIAGNOSIS — L0390 Cellulitis, unspecified: Secondary | ICD-10-CM

## 2011-05-23 DIAGNOSIS — Z302 Encounter for sterilization: Secondary | ICD-10-CM

## 2011-05-23 DIAGNOSIS — F1411 Cocaine abuse, in remission: Secondary | ICD-10-CM

## 2011-05-23 DIAGNOSIS — O24919 Unspecified diabetes mellitus in pregnancy, unspecified trimester: Secondary | ICD-10-CM

## 2011-05-23 DIAGNOSIS — O2432 Unspecified pre-existing diabetes mellitus in childbirth: Principal | ICD-10-CM | POA: Diagnosis present

## 2011-05-23 DIAGNOSIS — Z22330 Carrier of Group B streptococcus: Secondary | ICD-10-CM

## 2011-05-23 DIAGNOSIS — E119 Type 2 diabetes mellitus without complications: Secondary | ICD-10-CM | POA: Diagnosis present

## 2011-05-23 DIAGNOSIS — O99892 Other specified diseases and conditions complicating childbirth: Secondary | ICD-10-CM | POA: Diagnosis present

## 2011-05-23 DIAGNOSIS — O09529 Supervision of elderly multigravida, unspecified trimester: Secondary | ICD-10-CM | POA: Diagnosis present

## 2011-05-23 DIAGNOSIS — Z9851 Tubal ligation status: Secondary | ICD-10-CM

## 2011-05-23 LAB — CBC
HCT: 34.9 % — ABNORMAL LOW (ref 36.0–46.0)
MCH: 29.1 pg (ref 26.0–34.0)
MCV: 83.9 fL (ref 78.0–100.0)
Platelets: 293 10*3/uL (ref 150–400)
RBC: 4.16 MIL/uL (ref 3.87–5.11)
WBC: 18.7 10*3/uL — ABNORMAL HIGH (ref 4.0–10.5)

## 2011-05-23 LAB — GLUCOSE, CAPILLARY
Glucose-Capillary: 159 mg/dL — ABNORMAL HIGH (ref 70–99)
Glucose-Capillary: 194 mg/dL — ABNORMAL HIGH (ref 70–99)
Glucose-Capillary: 152 mg/dL — ABNORMAL HIGH (ref 70–99)
Glucose-Capillary: 142 mg/dL — ABNORMAL HIGH (ref 70–99)
Glucose-Capillary: 107 mg/dL — ABNORMAL HIGH (ref 70–99)
Glucose-Capillary: 82 mg/dL (ref 70–99)
Glucose-Capillary: 70 mg/dL (ref 70–99)

## 2011-05-23 MED ORDER — EPHEDRINE 5 MG/ML INJ
10.0000 mg | INTRAVENOUS | Status: DC | PRN
  Filled 2011-05-23 (×2): qty 4

## 2011-05-23 MED ORDER — PHENYLEPHRINE 40 MCG/ML (10ML) SYRINGE FOR IV PUSH (FOR BLOOD PRESSURE SUPPORT): 80.0000 ug | PREFILLED_SYRINGE | INTRAVENOUS | Status: DC | PRN

## 2011-05-23 MED ORDER — ONDANSETRON HCL 4 MG/2ML IJ SOLN: 4.0000 mg | Freq: Four times a day (QID) | INTRAMUSCULAR | Status: DC | PRN

## 2011-05-23 MED ORDER — PHENYLEPHRINE 40 MCG/ML (10ML) SYRINGE FOR IV PUSH (FOR BLOOD PRESSURE SUPPORT)
80.0000 ug | PREFILLED_SYRINGE | INTRAVENOUS | Status: DC | PRN
  Filled 2011-05-23 (×2): qty 5

## 2011-05-23 MED ORDER — LACTATED RINGERS IV SOLN
500.0000 mL | INTRAVENOUS | Status: DC | PRN
  Administered 2011-05-23 (×2): 500 mL via INTRAVENOUS

## 2011-05-23 MED ORDER — OXYTOCIN 20 UNITS IN LACTATED RINGERS INFUSION - SIMPLE
1.0000 m[IU]/min | INTRAVENOUS | Status: DC
  Administered 2011-05-23: 2 m[IU]/min via INTRAVENOUS
  Filled 2011-05-23: qty 1000

## 2011-05-23 MED ORDER — PENICILLIN G POTASSIUM 5000000 UNITS IJ SOLR
5.0000 10*6.[IU] | Freq: Once | INTRAVENOUS | Status: DC
  Administered 2011-05-23: 5 10*6.[IU] via INTRAVENOUS
  Filled 2011-05-23: qty 5

## 2011-05-23 MED ORDER — EPHEDRINE 5 MG/ML INJ: 10.0000 mg | INTRAVENOUS | Status: DC | PRN

## 2011-05-23 MED ORDER — IBUPROFEN 600 MG PO TABS: 600.0000 mg | ORAL_TABLET | Freq: Four times a day (QID) | ORAL | Status: DC | PRN

## 2011-05-23 MED ORDER — FLEET ENEMA 7-19 GM/118ML RE ENEM: 1.0000 | ENEMA | RECTAL | Status: DC | PRN

## 2011-05-23 MED ORDER — FENTANYL 2.5 MCG/ML BUPIVACAINE 1/10 % EPIDURAL INFUSION (WH - ANES)
INTRAMUSCULAR | Status: DC | PRN
  Administered 2011-05-23: 14 mL/h via EPIDURAL

## 2011-05-23 MED ORDER — ACETAMINOPHEN 325 MG PO TABS: 650.0000 mg | ORAL_TABLET | ORAL | Status: DC | PRN

## 2011-05-23 MED ORDER — FENTANYL 2.5 MCG/ML BUPIVACAINE 1/10 % EPIDURAL INFUSION (WH - ANES)
14.0000 mL/h | INTRAMUSCULAR | Status: DC
  Administered 2011-05-23 – 2011-05-24 (×4): 14 mL/h via EPIDURAL
  Filled 2011-05-23 (×6): qty 60

## 2011-05-23 MED ORDER — SODIUM CHLORIDE 0.9 % IV SOLN
INTRAVENOUS | Status: DC
  Administered 2011-05-23: 1.3 [IU]/h via INTRAVENOUS
  Administered 2011-05-23: 13:00:00 via INTRAVENOUS
  Filled 2011-05-23: qty 1

## 2011-05-23 MED ORDER — PENICILLIN G POTASSIUM 5000000 UNITS IJ SOLR
2.5000 10*6.[IU] | INTRAVENOUS | Status: DC
  Administered 2011-05-23 – 2011-05-24 (×5): 2.5 10*6.[IU] via INTRAVENOUS
  Filled 2011-05-23 (×7): qty 2.5

## 2011-05-23 MED ORDER — OXYTOCIN BOLUS FROM INFUSION
500.0000 mL | Freq: Once | INTRAVENOUS | Status: DC
  Filled 2011-05-23: qty 500

## 2011-05-23 MED ORDER — TERBUTALINE SULFATE 1 MG/ML IJ SOLN: 0.2500 mg | Freq: Once | INTRAMUSCULAR | Status: AC | PRN

## 2011-05-23 MED ORDER — SODIUM BICARBONATE 8.4 % IV SOLN
INTRAVENOUS | Status: DC | PRN
  Administered 2011-05-23: 4 mL via EPIDURAL

## 2011-05-23 MED ORDER — LACTATED RINGERS IV SOLN: INTRAVENOUS | Status: DC

## 2011-05-23 MED ORDER — DIPHENHYDRAMINE HCL 50 MG/ML IJ SOLN: 12.5000 mg | INTRAMUSCULAR | Status: DC | PRN

## 2011-05-23 MED ORDER — LIDOCAINE HCL (PF) 1 % IJ SOLN: 30.0000 mL | INTRAMUSCULAR | Status: DC | PRN

## 2011-05-23 MED ORDER — DEXTROSE 50 % IV SOLN: 25.0000 mL | INTRAVENOUS | Status: DC | PRN

## 2011-05-23 MED ORDER — DEXTROSE-NACL 5-0.45 % IV SOLN
INTRAVENOUS | Status: DC
  Administered 2011-05-23 – 2011-05-24 (×3): via INTRAVENOUS

## 2011-05-23 MED ORDER — CITRIC ACID-SODIUM CITRATE 334-500 MG/5ML PO SOLN
30.0000 mL | ORAL | Status: DC | PRN
  Filled 2011-05-23: qty 15

## 2011-05-23 MED ORDER — SODIUM CHLORIDE 0.9 % IV SOLN: INTRAVENOUS | Status: DC

## 2011-05-23 MED ORDER — NALBUPHINE SYRINGE 5 MG/0.5 ML: 10.0000 mg | INJECTION | INTRAMUSCULAR | Status: DC | PRN

## 2011-05-23 MED ORDER — OXYTOCIN 20 UNITS IN LACTATED RINGERS INFUSION - SIMPLE: 125.0000 mL/h | Freq: Once | INTRAVENOUS | Status: DC

## 2011-05-23 MED ORDER — LACTATED RINGERS IV SOLN: 500.0000 mL | Freq: Once | INTRAVENOUS | Status: DC

## 2011-05-23 MED ORDER — OXYCODONE-ACETAMINOPHEN 5-325 MG PO TABS: 2.0000 | ORAL_TABLET | ORAL | Status: DC | PRN

## 2011-05-23 NOTE — Progress Notes (Signed)
Caitlin Faulkner is a 35 y.o. [name] at [redacted]w[redacted]d   Subjective: Comfortable  Objective: BP 107/55  Pulse 91  Temp(Src) 97.4 F (36.3 C) (Oral)  Resp 20  Ht 5\' 8"  (1.727 m)  Wt 97.523 kg (215 lb)  BMI 32.69 kg/m2  LMP 08/23/2010      FHT:  FHR: 140 bpm, variability: moderate,  accelerations:  Present,  decelerations:  Present- occ variables to 90-110 x 1-3 mins with spont recovery to baseline UC:   irregular SVE:   Dilation: 1 Effacement (%): 60 Station: -2 Exam by:: Pincus Badder CNM  Labs: Lab Results  Component Value Date   WBC 18.7* 05/23/2011   HGB 12.1 05/23/2011   HCT 34.9* 05/23/2011   MCV 83.9 05/23/2011   PLT 293 05/23/2011   FBS 88  Assessment / Plan: Induction of labor due to gestational diabetes  Attempt to place foley bulb but unable Will start Pitocin and reeval    SHAW, KIMBERLY 05/23/2011, 11:09 AM

## 2011-05-23 NOTE — Anesthesia Preprocedure Evaluation (Signed)
Anesthesia Evaluation    Airway       Dental   Pulmonary          Cardiovascular     Neuro/Psych    GI/Hepatic   Endo/Other  Diabetes mellitus-  Renal/GU      Musculoskeletal   Abdominal   Peds  Hematology   Anesthesia Other Findings   Reproductive/Obstetrics                           Anesthesia Physical Anesthesia Plan  ASA: III  Anesthesia Plan: Epidural   Post-op Pain Management:    Induction:   Airway Management Planned:   Additional Equipment:   Intra-op Plan:   Post-operative Plan:   Informed Consent: I have reviewed the patients History and Physical, chart, labs and discussed the procedure including the risks, benefits and alternatives for the proposed anesthesia with the patient or authorized representative who has indicated his/her understanding and acceptance.   Dental Advisory Given  Plan Discussed with:   Anesthesia Plan Comments: (Labs checked- platelets confirmed with RN in room. Fetal heart tracing, per RN, reported to be stable enough for sitting procedure. Discussed epidural, and patient consents to the procedure:  included risk of possible headache,backache, failed block, allergic reaction, and nerve injury. This patient was asked if she had any questions or concerns before the procedure started. )        Anesthesia Quick Evaluation

## 2011-05-23 NOTE — Progress Notes (Signed)
Caitlin Faulkner is a 35 y.o. [name] at [redacted]w[redacted]d   Subjective: Comfortable with epidural  Objective: BP 120/94  Pulse 95  Temp(Src) 98.1 F (36.7 C) (Oral)  Resp 18  Ht 5\' 8"  (1.727 m)  Wt 97.523 kg (215 lb)  BMI 32.69 kg/m2  LMP 08/23/2010      FHT:  FHR: 135 bpm, variability: moderate,  accelerations:  Present,  decelerations:  Absent; occ mi variables, +SS UC:   regular, every 2-4 minutes with Pitocin SVE:   Dilation: 1.5 Effacement (%): 90 Station: 0 Exam by:: Pincus Badder, CNM; scar tissue broken during exam  On glucostabilizer; CBG most recently 77  Labs: Lab Results  Component Value Date   WBC 18.7* 05/23/2011   HGB 12.1 05/23/2011   HCT 34.9* 05/23/2011   MCV 83.9 05/23/2011   PLT 293 05/23/2011    IOL process Type II DM  Will continue to increase Pitocin to achieve adequate labor   Cam Hai 05/23/2011, 9:48 PM

## 2011-05-23 NOTE — Progress Notes (Signed)
Klohe Lovering is a 35 y.o. [id number] at [redacted]w[redacted]d  Subjective: Received epidural- comfortable  Objective: BP 114/62  Pulse 89  Temp(Src) 97.6 F (36.4 C) (Oral)  Resp 18  Ht 5\' 8"  (1.727 m)  Wt 97.523 kg (215 lb)  BMI 32.69 kg/m2  LMP 08/23/2010      FHT:  FHR: 135 bpm, variability: moderate,  accelerations:  Present; occ mi variables among accelerations UC:   regular, every 2 minutes with Pitocin at 10 mu/min SVE:   Dilation: 1 Effacement (%): 60 Station: -2  Most recent exam by RN- unchanged Labs: Lab Results  Component Value Date   WBC 18.7* 05/23/2011   HGB 12.1 05/23/2011   HCT 34.9* 05/23/2011   MCV 83.9 05/23/2011   PLT 293 05/23/2011    Assessment / Plan: IOL process DM- insulin gtt  Will continue to increase Pitocin to achieve adequate labor May need to break up scar tissue from LEEP in the future   Cam Hai 05/23/2011, 5:10 PM

## 2011-05-23 NOTE — Anesthesia Procedure Notes (Signed)

## 2011-05-23 NOTE — H&P (Signed)
Agree with above note.  Rollen Selders 05/23/2011 10:40 PM

## 2011-05-23 NOTE — H&P (Signed)
Caitlin Faulkner is a 35 y.o. female presenting for induction of labor secondary to type II DM. Denies leak or bldg. Reports +FM. [place] from 11/19 shows EFW to be 6+13 with nl fluid. Her preg has been followed by the [facility] and in addition to the DM has been remarkable for 1) prev CS with 4 prior SVD and one subsequent VBAC and 2) +GBS. Maternal Medical History:  Reason for admission: Reason for Admission:   nausea  OB History    Grav Para Term Preterm Abortions TAB SAB Ect Mult Living   9 6 3 3 2 1  0 1 0 5     Past Medical History  Diagnosis Date  . Preterm labor   . Cellulitis June 2012    right knee  . 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 Diabetes mellitus, antepartum   . History of kidney stones    Past Surgical History  Procedure Date  . Cesarean section   . Urinary stent 2002   Family History: family history is negative for Anesthesia problems, and Hypotension, and Malignant hyperthermia, and Pseudochol deficiency, . Social History:  reports that she has been smoking Cigarettes.  She has been smoking about .1 packs per day. She has never used smokeless tobacco. She reports that she does not drink alcohol or use illicit drugs.  Review of Systems  Constitutional: Negative for fever and chills.  Gastrointestinal: Negative for nausea and vomiting.  Genitourinary: Negative for dysuria.  Psychiatric/Behavioral: Negative for depression.      Blood pressure 98/53, pulse 82, temperature 97.4 F (36.3 C), temperature source Oral, resp. rate 20, height 5\' 8"  (1.727 m), weight 97.523 kg (215 lb), last menstrual period 08/23/2010. Maternal Exam:  Uterine Assessment: Rare/mild ctx  Cervix: Cx 1/60%/vtx -2, post  Fetal Exam Fetal Monitor Review: Baseline rate: 135.  Pattern: accelerations present.   One spont decel to 60s x ; reactive and reassuring prior and after     Physical Exam  Constitutional: She is oriented to person, place, and time. She  appears well-developed and well-nourished.  HENT:  Head: Normocephalic.  Neck: Normal range of motion.  Cardiovascular: Normal rate.   Respiratory: Effort normal.  Musculoskeletal: Normal range of motion.  Neurological: She is alert and oriented to person, place, and time.  Skin: Skin is warm and dry.  Psychiatric: She has a normal mood and affect. Her behavior is normal. Thought content normal.    Prenatal labs: ABO, Rh: AB/POS/-- (09/19 1156) Antibody: NEG (09/19 1156) Rubella: 40.9 (09/19 1156) RPR: NON REAC (09/19 1156)  HBsAg: NEGATIVE (09/19 1156)  HIV: NON REACTIVE (11/12 1056)  GBS: Positive (09/23 0000)   Assessment/Plan: IUP at term Type II DM- insulin Unfavorable cx  Will insert foley bulb and use Pitocin per pt request  PCN G for GBS status Monitor CBGs using glucostabilizer (initial CBG pending from lab)   Cam Hai 05/23/2011, 9:28 AM

## 2011-05-24 ENCOUNTER — Encounter (HOSPITAL_COMMUNITY): Payer: Self-pay

## 2011-05-24 DIAGNOSIS — O24919 Unspecified diabetes mellitus in pregnancy, unspecified trimester: Secondary | ICD-10-CM

## 2011-05-24 LAB — GLUCOSE, CAPILLARY
Glucose-Capillary: 100 mg/dL — ABNORMAL HIGH (ref 70–99)
Glucose-Capillary: 100 mg/dL — ABNORMAL HIGH (ref 70–99)
Glucose-Capillary: 105 mg/dL — ABNORMAL HIGH (ref 70–99)
Glucose-Capillary: 127 mg/dL — ABNORMAL HIGH (ref 70–99)
Glucose-Capillary: 131 mg/dL — ABNORMAL HIGH (ref 70–99)
Glucose-Capillary: 160 mg/dL — ABNORMAL HIGH (ref 70–99)
Glucose-Capillary: 162 mg/dL — ABNORMAL HIGH (ref 70–99)
Glucose-Capillary: 168 mg/dL — ABNORMAL HIGH (ref 70–99)
Glucose-Capillary: 82 mg/dL (ref 70–99)
Glucose-Capillary: 87 mg/dL (ref 70–99)

## 2011-05-24 MED ORDER — SENNOSIDES-DOCUSATE SODIUM 8.6-50 MG PO TABS
2.0000 | ORAL_TABLET | Freq: Every day | ORAL | Status: DC
  Administered 2011-05-24 – 2011-05-25 (×2): 2 via ORAL

## 2011-05-24 MED ORDER — METOCLOPRAMIDE HCL 10 MG PO TABS
10.0000 mg | ORAL_TABLET | Freq: Once | ORAL | Status: AC
  Administered 2011-05-25: 10 mg via ORAL
  Filled 2011-05-24 (×2): qty 1

## 2011-05-24 MED ORDER — ONDANSETRON HCL 4 MG/2ML IJ SOLN: 4.0000 mg | INTRAMUSCULAR | Status: DC | PRN

## 2011-05-24 MED ORDER — FAMOTIDINE 20 MG PO TABS
40.0000 mg | ORAL_TABLET | Freq: Once | ORAL | Status: AC
  Administered 2011-05-25: 40 mg via ORAL
  Filled 2011-05-24: qty 1

## 2011-05-24 MED ORDER — LACTATED RINGERS IV SOLN: INTRAVENOUS | Status: DC

## 2011-05-24 MED ORDER — BENZOCAINE-MENTHOL 20-0.5 % EX AERO
1.0000 "application " | INHALATION_SPRAY | CUTANEOUS | Status: DC | PRN
  Administered 2011-05-24: 1 via TOPICAL

## 2011-05-24 MED ORDER — ZOLPIDEM TARTRATE 5 MG PO TABS: 5.0000 mg | ORAL_TABLET | Freq: Every evening | ORAL | Status: DC | PRN

## 2011-05-24 MED ORDER — METFORMIN HCL 850 MG PO TABS
850.0000 mg | ORAL_TABLET | Freq: Two times a day (BID) | ORAL | Status: DC
  Administered 2011-05-24 – 2011-05-26 (×3): 850 mg via ORAL
  Filled 2011-05-24 (×6): qty 1

## 2011-05-24 MED ORDER — DIBUCAINE 1 % RE OINT: 1.0000 "application " | TOPICAL_OINTMENT | RECTAL | Status: DC | PRN

## 2011-05-24 MED ORDER — PRENATAL PLUS 27-1 MG PO TABS
1.0000 | ORAL_TABLET | Freq: Every day | ORAL | Status: DC
  Administered 2011-05-26: 1 via ORAL
  Filled 2011-05-24: qty 1

## 2011-05-24 MED ORDER — OXYCODONE-ACETAMINOPHEN 5-325 MG PO TABS
1.0000 | ORAL_TABLET | ORAL | Status: DC | PRN
  Administered 2011-05-24 – 2011-05-26 (×8): 1 via ORAL
  Administered 2011-05-26: 2 via ORAL
  Filled 2011-05-24 (×6): qty 1
  Filled 2011-05-24: qty 2
  Filled 2011-05-24 (×2): qty 1

## 2011-05-24 MED ORDER — LACTATED RINGERS IV SOLN
INTRAVENOUS | Status: DC
  Administered 2011-05-25: 14:00:00 via INTRAVENOUS
  Administered 2011-05-25: 1000 mL via INTRAVENOUS

## 2011-05-24 MED ORDER — LIDOCAINE-EPINEPHRINE (PF) 2 %-1:200000 IJ SOLN
INTRAMUSCULAR | Status: AC
  Filled 2011-05-24: qty 20

## 2011-05-24 MED ORDER — IBUPROFEN 600 MG PO TABS
600.0000 mg | ORAL_TABLET | Freq: Four times a day (QID) | ORAL | Status: DC
  Administered 2011-05-24 – 2011-05-26 (×7): 600 mg via ORAL
  Filled 2011-05-24 (×7): qty 1

## 2011-05-24 MED ORDER — LANOLIN HYDROUS EX OINT: TOPICAL_OINTMENT | CUTANEOUS | Status: DC | PRN

## 2011-05-24 MED ORDER — TETANUS-DIPHTH-ACELL PERTUSSIS 5-2.5-18.5 LF-MCG/0.5 IM SUSP: 0.5000 mL | Freq: Once | INTRAMUSCULAR | Status: DC

## 2011-05-24 MED ORDER — DIPHENHYDRAMINE HCL 25 MG PO CAPS: 25.0000 mg | ORAL_CAPSULE | Freq: Four times a day (QID) | ORAL | Status: DC | PRN

## 2011-05-24 MED ORDER — WITCH HAZEL-GLYCERIN EX PADS: 1.0000 "application " | MEDICATED_PAD | CUTANEOUS | Status: DC | PRN

## 2011-05-24 MED ORDER — SIMETHICONE 80 MG PO CHEW
80.0000 mg | CHEWABLE_TABLET | ORAL | Status: DC | PRN
  Administered 2011-05-26: 80 mg via ORAL

## 2011-05-24 MED ORDER — ONDANSETRON HCL 4 MG PO TABS: 4.0000 mg | ORAL_TABLET | ORAL | Status: DC | PRN

## 2011-05-24 MED ORDER — BENZOCAINE-MENTHOL 20-0.5 % EX AERO
INHALATION_SPRAY | CUTANEOUS | Status: AC
  Administered 2011-05-24: 1 via TOPICAL
  Filled 2011-05-24: qty 56

## 2011-05-24 MED ORDER — SODIUM BICARBONATE 8.4 % IV SOLN
INTRAVENOUS | Status: AC
  Filled 2011-05-24: qty 50

## 2011-05-24 NOTE — Progress Notes (Signed)
Repair in progress, to check CBG

## 2011-05-24 NOTE — Progress Notes (Signed)
Patient ID: Caitlin Faulkner, female   DOB: [birth year], 35 y.o.   MRN: [id number] Comfortable with epid  VSS, afeb FHR + accels, occ mi variables Ctx q 3 mins with Pit at 3mu/min Cx 2/C/-1  Foley bulb inserted without difficulty. Continue to increase Pitocin to achieve adequate labor  Cam Hai 05/24/2011 3:11 AM

## 2011-05-24 NOTE — Progress Notes (Signed)
Caitlin Faulkner is a 35 y.o. [name] at [redacted]w[redacted]d by ultrasound admitted for induction of labor due to Diabetes.  Subjective:   Objective: BP 105/62  Pulse 72  Temp(Src) 98.2 F (36.8 C) (Oral)  Resp 20  Ht 5\' 8"  (1.727 m)  Wt 97.523 kg (215 lb)  BMI 32.69 kg/m2  SpO2 100%  LMP 08/23/2010      FHT:  FHR: 145 bpm, variability: moderate,  accelerations:  Present,  decelerations:  Absent UC:   irregular, every 4-6  minutes SVE:   Dilation: 3.5 Effacement (%): 100 Station: -1 Exam by:: H.Norton, RN   Labs: Lab Results  Component Value Date   WBC 18.7* 05/23/2011   HGB 12.1 05/23/2011   HCT 34.9* 05/23/2011   MCV 83.9 05/23/2011   PLT 293 05/23/2011    Assessment / Plan: Induction of labor due to diabetes,  progressing well on pitocin  Labor: Progressing on Pitocin, will continue to increase then AROM Preeclampsia:  no signs or symptoms of toxicity Fetal Wellbeing:  Category I Pain Control:  Labor support without medications I/D:  n/a Anticipated MOD:  NSVD  Caitlin Faulkner 05/24/2011, 8:39 AM

## 2011-05-24 NOTE — Anesthesia Postprocedure Evaluation (Signed)
  Anesthesia Post-op Note  Patient: Caitlin Faulkner  Procedure(s) Performed: * No procedures listed *  Patient Location: PACU and Mother/Baby  Anesthesia Type: Epidural  Level of Consciousness: awake, alert  and oriented  Airway and Oxygen Therapy: Patient Spontanous Breathing   Post-op Assessment: Patient's Cardiovascular Status Stable and Respiratory Function Stable  Post-op Vital Signs: stable  Complications: No apparent anesthesia complications

## 2011-05-24 NOTE — Anesthesia Postprocedure Evaluation (Signed)
  Anesthesia Post-op Note  Patient: Caitlin Faulkner  Procedure(s) Performed: * No procedures listed * This patient has recovered from her labor epidural, and I am not aware of any complications or problems.

## 2011-05-25 ENCOUNTER — Inpatient Hospital Stay (HOSPITAL_COMMUNITY): Payer: Medicaid Other | Admitting: Certified Registered"

## 2011-05-25 ENCOUNTER — Encounter (HOSPITAL_COMMUNITY): Payer: Self-pay | Admitting: Certified Registered"

## 2011-05-25 ENCOUNTER — Encounter (HOSPITAL_COMMUNITY)
Admission: RE | Disposition: A | Discharge status: Home / Self Care | Payer: Self-pay | Source: Ambulatory Visit | Attending: Obstetrics & Gynecology

## 2011-05-25 DIAGNOSIS — Z302 Encounter for sterilization: Secondary | ICD-10-CM

## 2011-05-25 HISTORY — PX: TUBAL LIGATION: SHX77

## 2011-05-25 LAB — SURGICAL PCR SCREEN: MRSA, PCR: NEGATIVE

## 2011-05-25 LAB — GLUCOSE, CAPILLARY
Glucose-Capillary: 113 mg/dL — ABNORMAL HIGH (ref 70–99)
Glucose-Capillary: 122 mg/dL — ABNORMAL HIGH (ref 70–99)
Glucose-Capillary: 166 mg/dL — ABNORMAL HIGH (ref 70–99)

## 2011-05-25 SURGERY — LIGATION, FALLOPIAN TUBE, POSTPARTUM
Anesthesia: Epidural | Site: Abdomen | Laterality: Bilateral | Wound class: Clean Contaminated

## 2011-05-25 MED ORDER — MEPERIDINE HCL 25 MG/ML IJ SOLN: 6.2500 mg | INTRAMUSCULAR | Status: DC | PRN

## 2011-05-25 MED ORDER — PROPOFOL 10 MG/ML IV EMUL
INTRAVENOUS | Status: DC | PRN
  Administered 2011-05-25: 100 ug/kg/min via INTRAVENOUS

## 2011-05-25 MED ORDER — LIDOCAINE HCL (CARDIAC) 10 MG/ML IV SOLN
INTRAVENOUS | Status: DC | PRN
  Administered 2011-05-25: 50 mg via INTRAVENOUS

## 2011-05-25 MED ORDER — CEFAZOLIN SODIUM 1-5 GM-% IV SOLN
1.0000 g | Freq: Once | INTRAVENOUS | Status: DC
  Filled 2011-05-25: qty 50

## 2011-05-25 MED ORDER — CEFAZOLIN SODIUM 1-5 GM-% IV SOLN
INTRAVENOUS | Status: AC
  Administered 2011-05-25: 1 g via INTRAVENOUS
  Filled 2011-05-25: qty 50

## 2011-05-25 MED ORDER — EPHEDRINE SULFATE 50 MG/ML IJ SOLN
INTRAMUSCULAR | Status: AC
  Filled 2011-05-25: qty 1

## 2011-05-25 MED ORDER — PHENYLEPHRINE 40 MCG/ML (10ML) SYRINGE FOR IV PUSH (FOR BLOOD PRESSURE SUPPORT)
PREFILLED_SYRINGE | INTRAVENOUS | Status: AC
  Filled 2011-05-25: qty 5

## 2011-05-25 MED ORDER — FENTANYL CITRATE 0.05 MG/ML IJ SOLN
25.0000 ug | INTRAMUSCULAR | Status: DC | PRN
  Administered 2011-05-25 (×2): 50 ug via INTRAVENOUS

## 2011-05-25 MED ORDER — FENTANYL CITRATE 0.05 MG/ML IJ SOLN
INTRAMUSCULAR | Status: AC
  Administered 2011-05-25: 50 ug via INTRAVENOUS
  Filled 2011-05-25: qty 2

## 2011-05-25 MED ORDER — PROMETHAZINE HCL 25 MG/ML IJ SOLN: 6.2500 mg | INTRAMUSCULAR | Status: DC | PRN

## 2011-05-25 MED ORDER — KETOROLAC TROMETHAMINE 30 MG/ML IJ SOLN
INTRAMUSCULAR | Status: AC
  Administered 2011-05-25: 30 mg via INTRAVENOUS
  Filled 2011-05-25: qty 1

## 2011-05-25 MED ORDER — LIDOCAINE-EPINEPHRINE (PF) 2 %-1:200000 IJ SOLN
INTRAMUSCULAR | Status: DC | PRN
  Administered 2011-05-25 (×2): 10 mL
  Administered 2011-05-25: 10 mL via INTRADERMAL

## 2011-05-25 MED ORDER — KETOROLAC TROMETHAMINE 30 MG/ML IJ SOLN
15.0000 mg | Freq: Once | INTRAMUSCULAR | Status: AC | PRN
  Administered 2011-05-25: 30 mg via INTRAVENOUS

## 2011-05-25 MED ORDER — BUPIVACAINE HCL (PF) 0.25 % IJ SOLN
INTRAMUSCULAR | Status: DC | PRN
  Administered 2011-05-25: 10 mL

## 2011-05-25 MED ORDER — PROPOFOL 10 MG/ML IV EMUL
INTRAVENOUS | Status: DC | PRN
  Administered 2011-05-25: 80 mg via INTRAVENOUS

## 2011-05-25 MED ORDER — ONDANSETRON HCL 4 MG/2ML IJ SOLN
INTRAMUSCULAR | Status: DC | PRN
  Administered 2011-05-25: 4 mg via INTRAVENOUS

## 2011-05-25 SURGICAL SUPPLY — 30 items
APL SKNCLS STERI-STRIP NONHPOA (GAUZE/BANDAGES/DRESSINGS)
BENZOIN TINCTURE PRP APPL 2/3 (GAUZE/BANDAGES/DRESSINGS) IMPLANT
CHLORAPREP W/TINT 26ML (MISCELLANEOUS) ×2 IMPLANT
CLIP FILSHIE TUBAL LIGA STRL (Clip) ×2 IMPLANT
CLOTH BEACON ORANGE TIMEOUT ST (SAFETY) ×2 IMPLANT
DRSG COVADERM 4X6 (GAUZE/BANDAGES/DRESSINGS) ×1 IMPLANT
ELECT REM PT RETURN 9FT ADLT (ELECTROSURGICAL) ×2
ELECTRODE REM PT RTRN 9FT ADLT (ELECTROSURGICAL) IMPLANT
GLOVE BIO SURGEON STRL SZ7 (GLOVE) ×2 IMPLANT
GLOVE BIOGEL PI IND STRL 7.0 (GLOVE) ×2 IMPLANT
GLOVE BIOGEL PI INDICATOR 7.0 (GLOVE) ×2
GOWN PREVENTION PLUS LG XLONG (DISPOSABLE) ×3 IMPLANT
HEMOSTAT SURGICEL 2X3 (HEMOSTASIS) ×1 IMPLANT
NDL HYPO 25X1 1.5 SAFETY (NEEDLE) ×1 IMPLANT
NEEDLE HYPO 25X1 1.5 SAFETY (NEEDLE) ×2 IMPLANT
NS IRRIG 1000ML POUR BTL (IV SOLUTION) ×2 IMPLANT
PACK ABDOMINAL MINOR (CUSTOM PROCEDURE TRAY) ×2 IMPLANT
PENCIL BUTTON HOLSTER BLD 10FT (ELECTRODE) ×2 IMPLANT
SPONGE LAP 4X18 X RAY DECT (DISPOSABLE) ×1 IMPLANT
STRIP CLOSURE SKIN 1/2X4 (GAUZE/BANDAGES/DRESSINGS) IMPLANT
SUT CHROMIC 2 0 TIES 18 (SUTURE) IMPLANT
SUT VIC AB 0 CT1 27 (SUTURE) ×2
SUT VIC AB 0 CT1 27XBRD ANBCTR (SUTURE) ×1 IMPLANT
SUT VIC AB 3-0 SH 27 (SUTURE) ×2
SUT VIC AB 3-0 SH 27X BRD (SUTURE) IMPLANT
SUT VICRYL 4-0 PS2 18IN ABS (SUTURE) ×2 IMPLANT
SYR CONTROL 10ML LL (SYRINGE) ×2 IMPLANT
TOWEL OR 17X24 6PK STRL BLUE (TOWEL DISPOSABLE) ×4 IMPLANT
TRAY FOLEY CATH 14FR (SET/KITS/TRAYS/PACK) ×2 IMPLANT
WATER STERILE IRR 1000ML POUR (IV SOLUTION) ×2 IMPLANT

## 2011-05-25 NOTE — Preoperative (Signed)
Beta Blockers   Reason not to administer Beta Blockers:Not Applicable 

## 2011-05-25 NOTE — Progress Notes (Signed)
Patient is a 35 y.o. [id number] PPD#1 s/p SVD who desires permanent sterilization.  Other reversible forms of contraception were discussed with patient; she declines all other modalities. Risks of procedure discussed with patient including but not limited to: risk of regret, permanence of method, bleeding, infection, injury to surrounding organs and need for additional procedures.  Failure risk of 0.5-1% with increased risk of ectopic gestation if pregnancy occurs was also discussed with patient.    Patient verbalized understanding of these risks and wants to proceed with sterilization.  Patient was examined, explained where the incision will be made, details of surgery discussed.  Written informed consent obtained. Medicaid papers signed on 04/20/11  To OR when ready.  Jaynie Collins, M.D. 05/25/2011 12:22 PM

## 2011-05-25 NOTE — Anesthesia Postprocedure Evaluation (Signed)
Anesthesia Post Note  Patient: Caitlin Faulkner  Procedure(s) Performed:  POST PARTUM TUBAL LIGATION - with filshie clips  Anesthesia type: Epidural  Patient location: pacu  Post pain: Pain level controlled  Post assessment: Post-op Vital signs reviewed  Last Vitals:  Filed Vitals:   05/25/11 1430  BP: 110/71  Pulse: 83  Temp:   Resp: 14    Post vital signs: Reviewed  Level of consciousness: awake  Complications: No apparent anesthesia complications

## 2011-05-25 NOTE — Progress Notes (Signed)
Per Dr Macon Large verbal order, do not need to check sugar level again until patient starts back eating food.  CBG this morning at 0753 was 86.  Verified verbal order.

## 2011-05-25 NOTE — Transfer of Care (Signed)
Immediate Anesthesia Transfer of Care Note  Patient: Caitlin Faulkner  Procedure(s) Performed:  POST PARTUM TUBAL LIGATION - with filshie clips  Patient Location: PACU  Anesthesia Type: Epidural  Level of Consciousness: awake, alert  and oriented  Airway & Oxygen Therapy: Patient Spontanous Breathing and Patient connected to nasal cannula oxygen  Post-op Assessment: Report given to PACU RN and Post -op Vital signs reviewed and stable  Post vital signs: Reviewed and stable  Complications: No apparent anesthesia complications

## 2011-05-25 NOTE — Progress Notes (Addendum)
I have seen and examined this patient in conjunction with Mosetta Putt, PA-S.  I have taken this history and performed the exam.  I agree with the note as written above and have made corrections as needed.   Caitlin Faulkner 05/25/2011 9:56 AM

## 2011-05-25 NOTE — Progress Notes (Signed)
PSYCHOSOCIAL ASSESSMENT ~ MATERNAL/CHILD  Name: Caitlin Faulkner Age: 35  Referral Date: 11 / 26 /12  Reason/Source: LPNC and MJ use / CN  I. FAMILY/HOME ENVIRONMENT  A. Child's Legal Guardian _X__Parent(s) ___Grandparent ___Foster parent ___DSS_________________  Name: Caitlin Faulkner DOB: // Age: 35  Address: [address] ; [place], [place] [address]  Name: Caitlin Faulkner DOB: // Age: 32  Address:  B. Other Household Members/Support Persons Name: Relationship: daughter DOB ___/___/___  Name: Relationship: DOB ___/___/___  Name: Relationship: DOB ___/___/___  Name: Relationship: DOB ___/___/___  C. Other Support:  II. PSYCHOSOCIAL DATA A. Information Source _X_Patient Interview __Family Interview __Other___________ B. Financial and Community Resources __Employment:  _X_Medicaid County: [place] __Private Insurance: __Self Pay  _X_Food Stamps _X_WIC __Work First __Public Housing __Section 8  _X_Maternity Care Coordination/Child Service Coordination/Early Intervention: Caitlin Faulkner  ___School: Grade:  __Other:  C. Cultural and Environment Information Cultural Issues Impacting Care:  III. STRENGTHS _X__Supportive family/friends  _X__Adequate Resources  ___Compliance with medical plan  _X__Home prepared for Child (including basic supplies)  ___Understanding of illness  ___Other:  RISK FACTORS AND CURRENT PROBLEMS ____No Problems Noted  LPNC  Substance abuse history  IV. SOCIAL WORK ASSESSMENT Sw met with 35 year old, G9P6 referred for LPNC and MJ use. Pt learned of pregnancy at 19 weeks but couldn't start PNC until Medicaid was approved. She started PNC at 26-27 weeks and reports keeping her appointments regularly. She admit to smoking MJ the "first couple of months, once or twice" during the pregnancy. She denies any use in months. Pt acknowledges a long history of crack abuse but denies any use in 4 1/2 years. Pt did however admit to snorting cocaine in April or May of this  year. She denies other illegal substance use. UDS is negative and meconium is pending. Pt lives with her husband( who is not the FOB) and daughter. FOB, Caitlin is at the bedside. She has an open CPS case with Caitlin Faulkner. This Sw called and left a message requesting a call back. Pt 2 oldest children are with their father in [place], and 2 other children were adopted. She reports having supplies for the infant and adequate support. Sw will follow up with drug screen results and make referral if needed.  V. SOCIAL WORK PLAN _X__No Further Intervention Required/No Barriers to Discharge  ___Psychosocial Support and Ongoing Assessment of Needs  ___Patient/Family Education:  ___Child Protective Services Report County___________ Date___/____/____  ___Information/Referral to Community Resources_________________________  ___Other:    _X_Patient Interview  __Family Interview           __Other___________  B. Surveyor, [employer] and [employer] __Employment: _X_Medicaid    [place]:  [place]               __Private Insurance:                   __Self Pay  _X_Food Stamps   _X_WIC __Work First     __Public Housing     __Section 8    _X_Maternity Care Coordination/Child Service Coordination/Early Intervention: Caitlin Faulkner   ___School:                                                                         Grade:  __Other:   Caitlin Faulkner Cultural and Environment Information Cultural Issues Impacting Care:  III. STRENGTHS _X__Supportive family/friends _X__Adequate Resources ___Compliance with medical  plan _X__Home prepared for Child (including basic supplies) ___Understanding of illness      ___Other: RISK FACTORS AND CURRENT PROBLEMS         ____No Problems Noted               LPNC  Substance abuse history                                                                                                                                                                                                                                      IV. SOCIAL WORK ASSESSMENT  Sw met with 26 year old, G9P6 referred for [facility] and MJ use.  Pt learned of pregnancy at 19 weeks but couldn't start [facility] until Medicaid was approved.  She started [facility] at 26-27 weeks and reports keeping her appointments regularly.  She admit to smoking MJ the "first couple of months, once or twice" during the pregnancy.  She denies any use in months.  Pt acknowledges a long history of crack abuse but denies any use in 4 1/2 years.  Pt did however admit to snorting cocaine in April or May of this year.  She denies other illegal substance use.  UDS is negative and meconium is pending.  Pt lives with  her husband( who is not the FOB) and daughter.  FOB, Caitlin Faulkner is at the bedside.  She has an open CPS case with Caitlin Faulkner.  This Sw called and left a message requesting a call back.  Pt 2 oldest children are with their father in [place], and 2 other children were adopted.  She reports having supplies for the infant and adequate support.  Sw will follow up with drug screen results and make referral if needed.    V. SOCIAL WORK PLAN  _X__No Further Intervention Required/No Barriers to Discharge   ___Psychosocial Support and Ongoing Assessment of Needs   ___Patient/Family Education:   ___Child Protective Services Report   County___________ Date___/____/____   ___Information/Referral to [employer] Resources_________________________   ___Other:

## 2011-05-25 NOTE — Progress Notes (Signed)
UR chart review completed.  

## 2011-05-25 NOTE — Op Note (Signed)
Jameelah Watts 05/23/2011 - 05/25/2011  PREOPERATIVE DIAGNOSIS:  Multiparity, undesired fertility  POSTOPERATIVE DIAGNOSIS:  Multiparity, undesired fertility  PROCEDURE:  Postpartum Bilateral Tubal Sterilization  ANESTHESIA:  Epidural and local analgesia using 0.25% Marcaine  COMPLICATIONS:  Left fallopian tube avulsed from the cornual attachment.  Both ends of the avulsed tube were fulgurated and ligated.  Right fallopian tube also had some bleeding in the mesosalpinx, controlled with coagulation. Both fallopian tubes were noted to be very friable.  ESTIMATED BLOOD LOSS: 20 ml.  FLUIDS: 1200 ml LR.   INDICATIONS: 35 y.o. [id number]  with undesired fertility,status post vaginal delivery, desires permanent sterilization.  Other reversible forms of contraception were discussed with patient; she declines all other modalities. Risks of procedure discussed with patient including but not limited to: risk of regret, permanence of method, bleeding, infection, injury to surrounding organs and need for additional procedures.  Failure risk of 0.5-1% with increased risk of ectopic gestation if pregnancy occurs was also discussed with patient.     FINDINGS:  Normal uterus, tubes, and ovaries.  PROCEDURE DETAILS: The patient was taken to the operating room where her epidural anesthesia was dosed up to surgical level and found to be adequate.  She was then placed in the dorsal supine position and prepped and draped in sterile fashion.  After an adequate timeout was performed, attention was turned to the patient's abdomen where a small transverse skin incision was made under the umbilical fold. The incision was taken down to the layer of fascia using the scalpel; the fascia was incised, and extended bilaterally using Mayo scissors. The peritoneum was entered in a sharp fashion. Attention was then turned to the patient's uterus, and the right fallopian tube was identified and followed out to the fimbriated end.   A Filshie clip was placed on the right fallopian tube about 2 cm from the cornual attachment, with care given to incorporate the underlying mesosalpinx.  The right fallopian tube was noted to be friable during the sterilization and there was some bleeding in the mesosalpinx which was controlled with coagulation.  On the patient's left side, the fallopian tube was identified.  Using Babcock clamps, the left fallopian tube was grasped and also noted to be friable. It was noted to avulse from the cornual attachment and some bleeding was noted.  The proximal cornual stump was fulgurated, and ligated using 3-0 Vicryl figure-of-eight stitches. Then the distal end of the tube was also fulgurated and ligated with 3-0 Vicryl. Surgicel was placed on the left cornual area.  The right fallopian tube was then re-examined and also found to be hemostatic.  The instruments were then removed from the patient's abdomen, the fascial incision was repaired with 0 Vicryl, and the skin was closed with a 4-0 Vicryl subcuticular stitch. The patient tolerated the procedure well.  Instrument, sponge, and needle counts were correct times two.  The patient was then taken to the recovery room awake, and in stable condition.

## 2011-05-25 NOTE — Anesthesia Preprocedure Evaluation (Addendum)
Anesthesia Evaluation  Patient identified by MRN, date of birth, ID band Patient awake    Reviewed: Allergy & Precautions, H&P , NPO status , Patient's Chart, lab work & pertinent test results  Airway Mallampati: II TM Distance: >3 FB Neck ROM: full    Dental No notable dental hx.    Pulmonary neg pulmonary ROS,    Pulmonary exam normal       Cardiovascular neg cardio ROS     Neuro/Psych Negative Neurological ROS  Negative Psych ROS   GI/Hepatic   Endo/Other  Diabetes mellitus-  Renal/GU   Genitourinary negative   Musculoskeletal negative musculoskeletal ROS (+)   Abdominal Normal abdominal exam  (+)   Peds negative pediatric ROS (+)  Hematology negative hematology ROS (+)   Anesthesia Other Findings   Reproductive/Obstetrics                           Anesthesia Physical  Anesthesia Plan  ASA: III  Anesthesia Plan: Epidural   Post-op Pain Management:    Induction:   Airway Management Planned:   Additional Equipment:   Intra-op Plan:   Post-operative Plan:   Informed Consent: I have reviewed the patients History and Physical, chart, labs and discussed the procedure including the risks, benefits and alternatives for the proposed anesthesia with the patient or authorized representative who has indicated his/her understanding and acceptance.   Dental Advisory Given  Plan Discussed with:   Anesthesia Plan Comments: (Labs checked- platelets confirmed with RN in room. Fetal heart tracing, per RN, reported to be stable enough for sitting procedure. Discussed epidural, and patient consents to the procedure:  included risk of possible headache,backache, failed block, allergic reaction, and nerve injury. This patient was asked if she had any questions or concerns before the procedure started. )        Anesthesia Quick Evaluation

## 2011-05-25 NOTE — Progress Notes (Signed)
Post Partum Day 1 Subjective: no complaints, up ad lib, voiding and tolerating PO. Patient reports mild lower abdominal pain.  Objective: Blood pressure 126/76, pulse 77, temperature 98.2 F (36.8 C), temperature source Oral, resp. rate 18, height 5\' 8"  (1.727 m), weight 97.523 kg (215 lb), last menstrual period 08/23/2010, SpO2 84.00%, unknown if currently breastfeeding.  Physical Exam:  General: alert, cooperative and no distress Heart: regular rate rhythm. No rubs, murmurs, gallops. Lungs: clear to auscultation bilaterally. No wheezes, rhonchi, crackles. Abdomen: active bowel sounds, tenderness with palpation over lower abdomen. Nontender and soft in all other quadrants. Extremities: 1+ edema bilaterally lower extremities. No erythema, rashes, lesions. Lochia: appropriate Uterine Fundus: firm DVT Evaluation: No cords or calf tenderness.   Basename 05/23/11 0736  HGB 12.1  HCT 34.9*   Last glucose 7:58 a.m. - 86 mg/dL (NPO for surgery, metformin held).  Assessment/Plan: Contraception bilateral tubal ligation - to be done today. Patient is bottle and breast feeding at the moment.    LOS: 2 days   Mosetta Putt, PA-S 05/25/2011, 7:55 AM

## 2011-05-26 ENCOUNTER — Encounter (HOSPITAL_COMMUNITY): Payer: Self-pay | Admitting: Obstetrics & Gynecology

## 2011-05-26 DIAGNOSIS — Z302 Encounter for sterilization: Secondary | ICD-10-CM

## 2011-05-26 LAB — GLUCOSE, CAPILLARY

## 2011-05-26 MED ORDER — OXYCODONE-ACETAMINOPHEN 5-325 MG PO TABS: 1.0000 | ORAL_TABLET | ORAL | Status: AC | PRN

## 2011-05-26 MED ORDER — METFORMIN HCL 850 MG PO TABS: 850.0000 mg | ORAL_TABLET | Freq: Two times a day (BID) | ORAL | Status: DC

## 2011-05-26 MED ORDER — IBUPROFEN 600 MG PO TABS: 600.0000 mg | ORAL_TABLET | Freq: Four times a day (QID) | ORAL | Status: AC

## 2011-05-26 MED ORDER — SIMETHICONE 80 MG PO CHEW: 80.0000 mg | CHEWABLE_TABLET | Freq: Four times a day (QID) | ORAL | Status: DC | PRN

## 2011-05-26 NOTE — Discharge Summary (Signed)
Obstetric Discharge Summary Reason for Admission: induction of labor Prenatal Procedures: none Intrapartum Procedures: spontaneous vaginal delivery Postpartum Procedures: P.P. tubal ligation Complications-Operative and Postpartum: none Hemoglobin  Date Value Range Status  05/23/2011 12.1  12.0-15.0 (g/dL) Final     HCT  Date Value Range Status  05/23/2011 34.9* 36.0-46.0 (%) Final    Discharge Diagnoses: Term Pregnancy-delivered  Discharge Information: Date: 05/26/2011 Activity: pelvic rest Diet: routine Medications: Percocet Condition: stable Instructions: refer to practice specific booklet Discharge to: home Follow-up Information    Follow up with [facility]. Make an appointment in 6 weeks.   Contact information:   [address] [place] [address]          Newborn Data: Live born female  Birth Weight: 6 lb 11.2 oz (3039 g) APGAR: 8, 9  Home with mother.  Maren Reamer, Donni Oglesby 05/26/2011, 11:41 AM

## 2011-05-26 NOTE — Progress Notes (Signed)
Pt seen and evaluated. Agree with above.  Discussed with student.

## 2011-05-26 NOTE — Progress Notes (Signed)
Post Partum Day 2 Subjective: no complaints, up ad lib, voiding and tolerating PO. Patient reports she has not had bowel movements but is taking stool softener. She also reports having excessive gas that causes upper abdominal discomfort. Requests for medication to help with this. Patient reports bilateral tubal ligation was well tolerated despite some complications.  Objective: Blood pressure 124/80, pulse 82, temperature 98.5 F (36.9 C), temperature source Oral, resp. rate 18, height 5\' 8"  (1.727 m), weight 97.523 kg (215 lb), last menstrual period 08/23/2010, SpO2 94.00%, unknown if currently breastfeeding.  Physical Exam:  General: alert, cooperative and no distress Heart: Regular rate and rhythm. No murmurs, rubs, gallops Lungs: clear to auscultation bilaterally. No wheezes, rhonchi, crackles Abdomen: soft, nontender. Mild ongoing discomfort with gas in upper abdomen. Active bowel sounds in all four quadrants. Lochia: appropriate Uterine Fundus: firm Incision: healing well, no significant drainage DVT Evaluation: No cords or calf tenderness. No edema bilaterally.  No results found for this basename: HGB:2,HCT:2 in the last 72 hours  Assessment/Plan: Discharge home and Breastfeeding/Bottle feeding. Patient is agreeable to go home today. Will recommend simethicone for gas relief. Patient will be [facility] up in [facility].    LOS: 3 days   Mosetta Putt, PA-S 05/26/2011, 7:39 AM

## 2011-06-26 ENCOUNTER — Ambulatory Visit: Payer: Medicaid Other | Admitting: Obstetrics and Gynecology

## 2011-09-06 ENCOUNTER — Other Ambulatory Visit: Payer: Self-pay | Admitting: Obstetrics & Gynecology

## 2011-09-06 DIAGNOSIS — K219 Gastro-esophageal reflux disease without esophagitis: Secondary | ICD-10-CM

## 2012-04-15 IMAGING — US US OB DETAIL+14 WK
1 series · 12 of 28 positions shown · non-contrast
Comparison: none

[Series 1: us ob comp +14 wk · 12 of 69 slices shown]
[im 3/69]
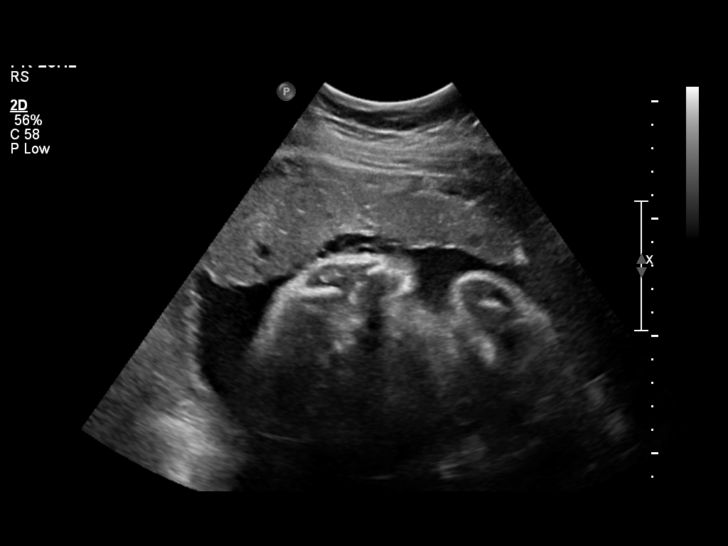
[im 8/69]
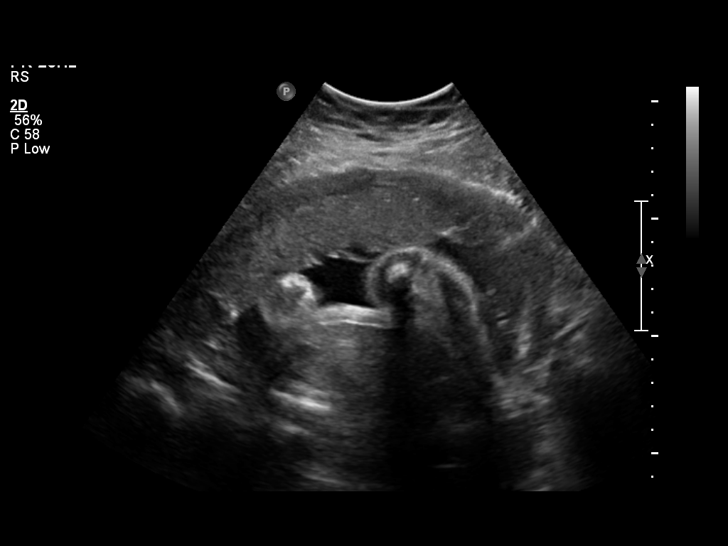
[im 13/69]
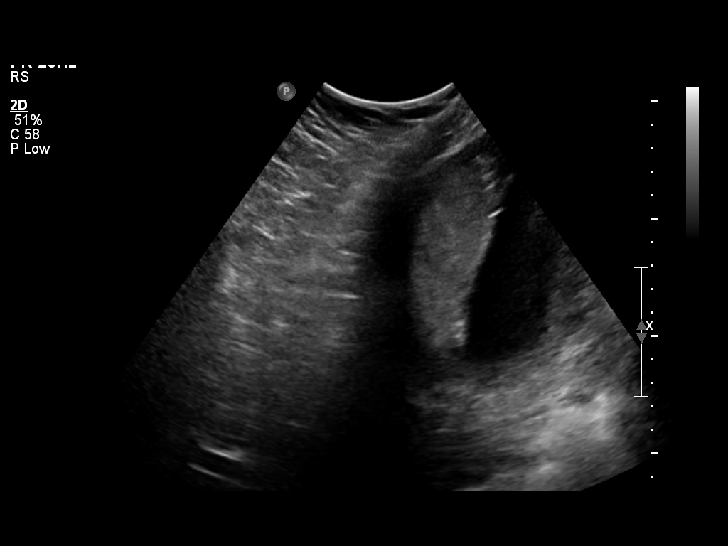
[im 21/69]
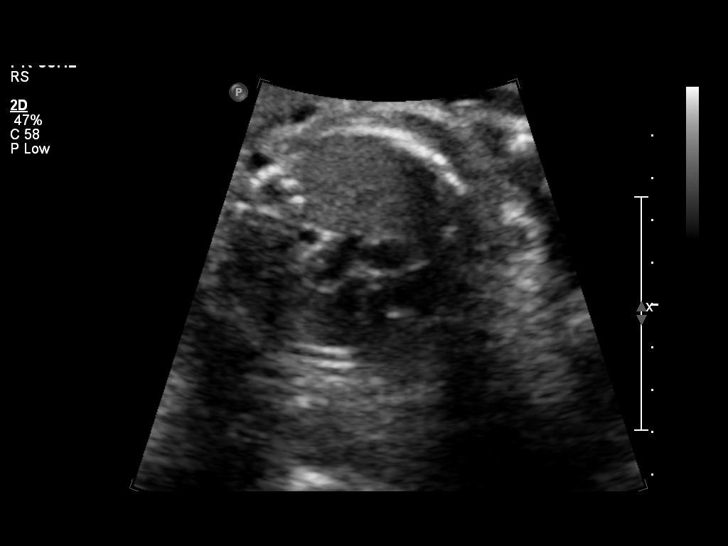
[im 26/69]
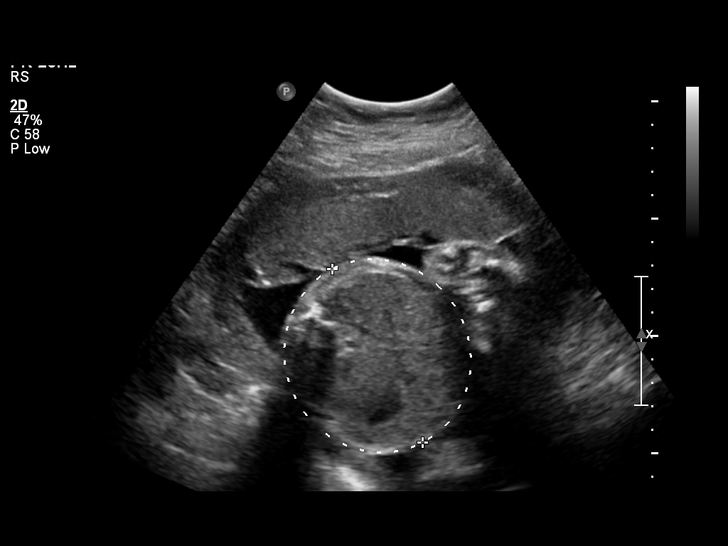
[im 31/69]
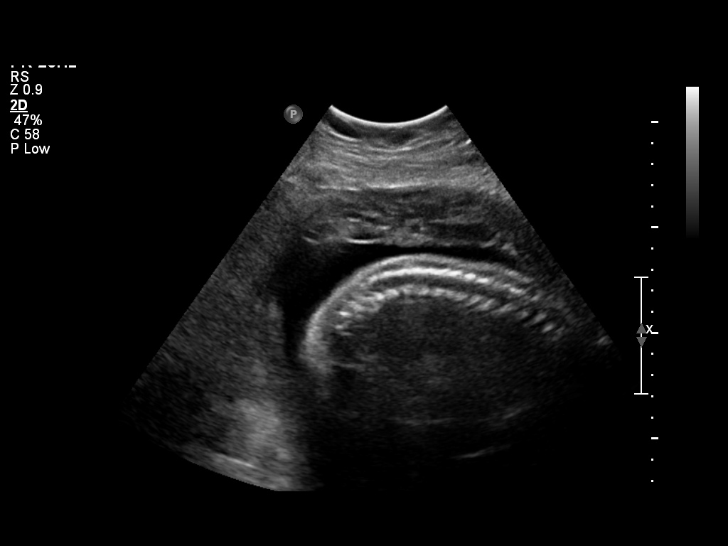
[im 38/69]
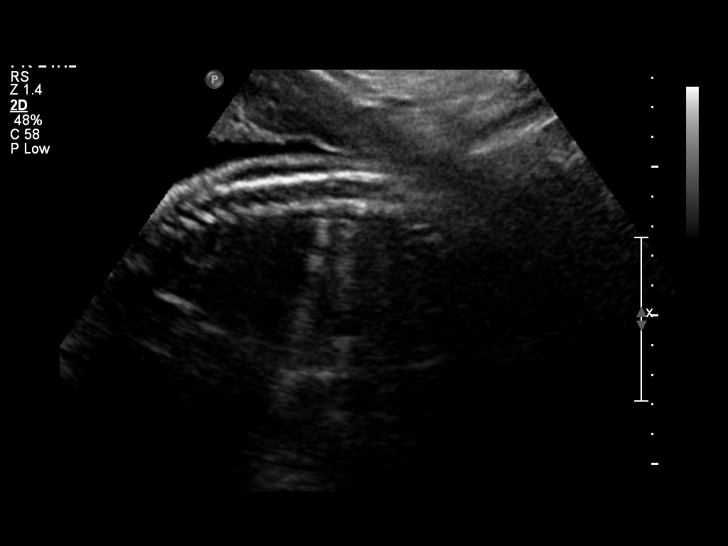
[im 43/69]
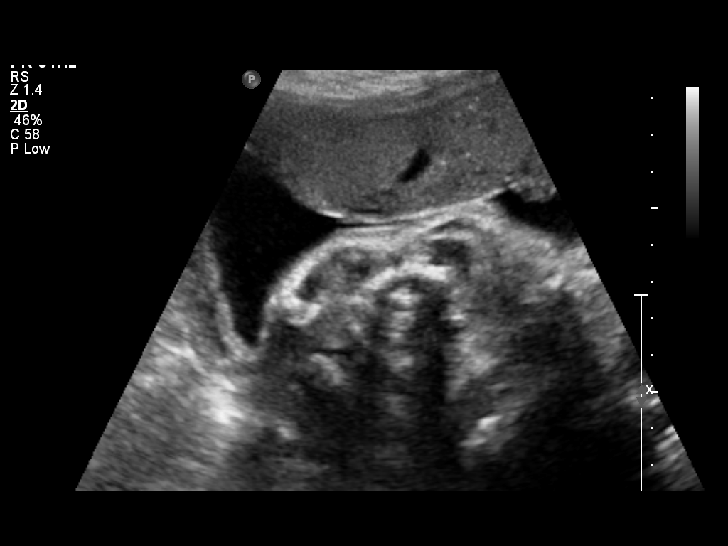
[im 48/69]
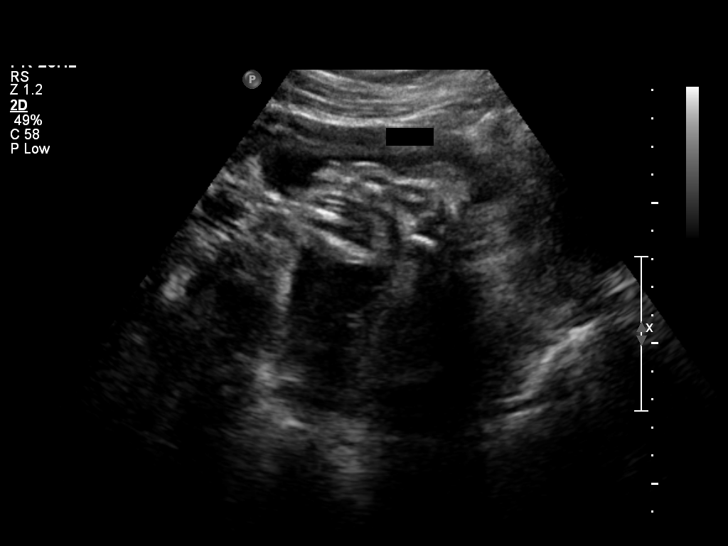
[im 56/69]
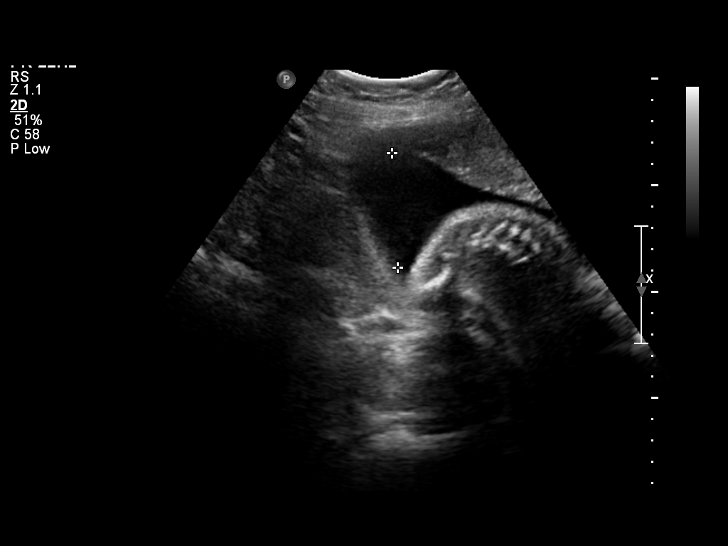
[im 61/69]
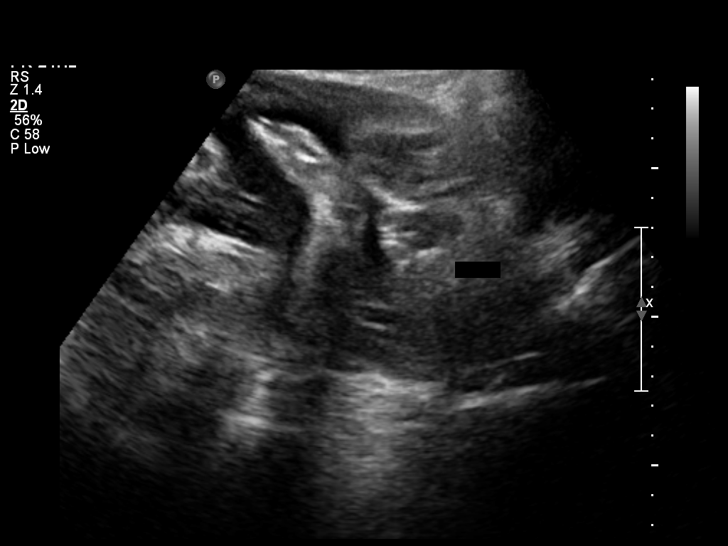
[im 66/69]
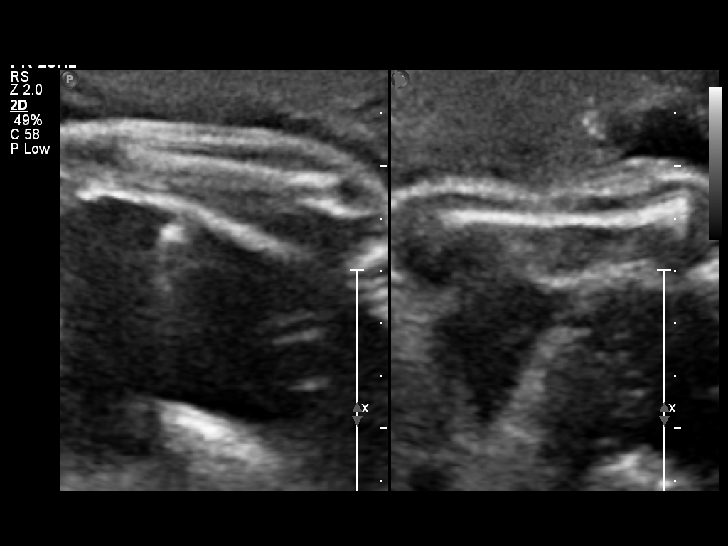

[12 of 28 positions shown; findings below may reference images not displayed]

OBSTETRICS REPORT
                      (Signed Final 03/18/2011 [DATE])

 Order#:         [id number]
Procedures

 US OB Detail + 14 WK                                  76811.0
Indications

 No or Little Prenatal Care
 Previous cesarean section
 Diabetes - Pregestational, Class B
Fetal Evaluation

 Fetal Heart Rate:  140                          bpm
 Cardiac Activity:  Observed
 Presentation:      Breech
 Placenta:          Anterior, above cervical os
 P. Cord            Visualized
 Insertion:

 Amniotic Fluid
 AFI FV:      Subjectively within normal limits
 AFI Sum:     20.34   cm       80  %Tile     Larg Pckt:    7.48  cm
 RUQ:   5.35    cm   RLQ:    7.48   cm    LUQ:   5.45    cm   LLQ:    2.06   cm
Biometry

 BPD:     72.2  mm     G. Age:  29w 0d                CI:         77.1   70 - 86
 OFD:     93.7  mm                                    FL/HC:      20.1   19.2 -

 HC:     273.6  mm     G. Age:  29w 6d       24  %    HC/AC:      1.08   0.99 -

 AC:     253.7  mm     G. Age:  29w 4d       44  %    FL/BPD:     76.3   71 - 87
 FL:      55.1  mm     G. Age:  29w 0d       22  %    FL/AC:      21.7   20 - 24

 Est. FW:    0458  gm      3 lb 1 oz     49  %
Gestational Age

 LMP:           29w 4d        Date:  08/23/10                 EDD:   05/30/11
 U/S Today:     29w 3d                                        EDD:   05/31/11
 Best:          29w 4d     Det. By:  LMP  (08/23/10)          EDD:   05/30/11
Anatomy
 Cranium:           Appears normal      Aortic Arch:       Not well
                                                           visualized
 Fetal Cavum:       Appears normal      Ductal Arch:       Not well
                                                           visualized
 Ventricles:        Appears normal      Diaphragm:         Not well
                                                           visualized
 Choroid Plexus:    Not well            Stomach:           Appears
                    visualized                             normal, left
                                                           sided
 Cerebellum:        Appears normal      Abdomen:           Appears normal
 Posterior Fossa:   Appears normal      Abdominal Wall:    Appears nml
                                                           (cord insert,
                                                           abd wall)
 Nuchal Fold:       Not applicable      Cord Vessels:      Appears normal
                    (>20 wks GA)                           (3 vessel cord)
 Face:              Orbits appear       Kidneys:           Appear normal
                    normal
 Heart:             Not well            Bladder:           Appears normal
                    visualized
 RVOT:              Not well            Spine:             Not well
                    visualized                             visualized
 LVOT:              Not well            Limbs:             Not well
                    visualized                             visualized

 Other:     Female gender. Technically difficult due to advanced GA
            and fetal position.
Cervix Uterus Adnexa

 Cervical Length:    3.1      cm

 Cervix:       Normal appearance by transabdominal scan.

 Adnexa:     No abnormality visualized.
Impression

 Single live IUP in breech presentation.   Concordant
 measurements/assigned GA by LMP.
 No anomaly seen in visualized structures as listed above.
 Multiple structures not visualized due to fetal position and
 advanced GA.

## 2012-12-03 DIAGNOSIS — F419 Anxiety disorder, unspecified: Secondary | ICD-10-CM

## 2012-12-03 DIAGNOSIS — E119 Type 2 diabetes mellitus without complications: Secondary | ICD-10-CM

## 2012-12-03 HISTORY — DX: Anxiety disorder, unspecified: F41.9

## 2012-12-03 HISTORY — DX: Type 2 diabetes mellitus without complications: E11.9

## 2013-08-29 DIAGNOSIS — F988 Other specified behavioral and emotional disorders with onset usually occurring in childhood and adolescence: Secondary | ICD-10-CM

## 2013-08-29 HISTORY — DX: Other specified behavioral and emotional disorders with onset usually occurring in childhood and adolescence: F98.8

## 2014-04-30 ENCOUNTER — Encounter (HOSPITAL_COMMUNITY): Payer: Self-pay | Admitting: Obstetrics & Gynecology

## 2015-12-09 ENCOUNTER — Ambulatory Visit (HOSPITAL_COMMUNITY)
Admission: EM | Admit: 2015-12-09 | Discharge: 2015-12-09 | Discharge status: Absent without leave | Payer: Medicaid Other

## 2022-04-21 ENCOUNTER — Other Ambulatory Visit: Payer: Self-pay

## 2022-04-21 ENCOUNTER — Emergency Department (HOSPITAL_COMMUNITY)
Admission: EM | Admit: 2022-04-21 | Discharge: 2022-04-22 | Discharge status: Against Medical Advice | Payer: Commercial Managed Care - HMO | Attending: Emergency Medicine | Admitting: Emergency Medicine

## 2022-04-21 ENCOUNTER — Encounter (HOSPITAL_COMMUNITY): Payer: Self-pay

## 2022-04-21 DIAGNOSIS — R300 Dysuria: Secondary | ICD-10-CM | POA: Diagnosis present

## 2022-04-21 DIAGNOSIS — Z5321 Procedure and treatment not carried out due to patient leaving prior to being seen by health care provider: Secondary | ICD-10-CM | POA: Insufficient documentation

## 2022-04-21 DIAGNOSIS — N390 Urinary tract infection, site not specified: Secondary | ICD-10-CM | POA: Insufficient documentation

## 2022-04-21 LAB — CBC WITH DIFFERENTIAL/PLATELET
Abs Immature Granulocytes: 0.01 10*3/uL (ref 0.00–0.07)
Basophils Absolute: 0.1 10*3/uL (ref 0.0–0.1)
Basophils Relative: 1 %
Eosinophils Absolute: 0.1 10*3/uL (ref 0.0–0.5)
Eosinophils Relative: 1 %
HCT: 42.6 % (ref 36.0–46.0)
Hemoglobin: 14.5 g/dL (ref 12.0–15.0)
Immature Granulocytes: 0 %
Lymphocytes Relative: 45 %
Lymphs Abs: 3.7 10*3/uL (ref 0.7–4.0)
MCH: 28.3 pg (ref 26.0–34.0)
MCHC: 34 g/dL (ref 30.0–36.0)
MCV: 83.2 fL (ref 80.0–100.0)
Monocytes Absolute: 0.5 10*3/uL (ref 0.1–1.0)
Monocytes Relative: 7 %
Neutro Abs: 3.7 10*3/uL (ref 1.7–7.7)
Neutrophils Relative %: 46 %
Platelets: 294 10*3/uL (ref 150–400)
RBC: 5.12 MIL/uL — ABNORMAL HIGH (ref 3.87–5.11)
RDW: 12.2 % (ref 11.5–15.5)
WBC: 8.1 10*3/uL (ref 4.0–10.5)
nRBC: 0 % (ref 0.0–0.2)

## 2022-04-21 LAB — URINALYSIS, ROUTINE W REFLEX MICROSCOPIC
Bacteria, UA: NONE SEEN
Bilirubin Urine: NEGATIVE
Glucose, UA: >=500 mg/dL — AB
Hgb urine dipstick: NEGATIVE
Ketones, ur: NEGATIVE mg/dL
Leukocytes,Ua: NEGATIVE
Nitrite: NEGATIVE
Protein, ur: NEGATIVE mg/dL
Specific Gravity, Urine: 1.04 — ABNORMAL HIGH (ref 1.005–1.030)
pH: 6 (ref 5.0–8.0)

## 2022-04-21 LAB — LIPASE, BLOOD: Lipase: 38 U/L (ref 11–51)

## 2022-04-21 LAB — COMPREHENSIVE METABOLIC PANEL
ALT: 49 U/L — ABNORMAL HIGH (ref 0–44)
AST: 28 U/L (ref 15–41)
Albumin: 3.6 g/dL (ref 3.5–5.0)
Alkaline Phosphatase: 150 U/L — ABNORMAL HIGH (ref 38–126)
Anion gap: 11 (ref 5–15)
BUN: 14 mg/dL (ref 6–20)
CO2: 25 mmol/L (ref 22–32)
Calcium: 9.2 mg/dL (ref 8.9–10.3)
Chloride: 100 mmol/L (ref 98–111)
Creatinine, Ser: 0.73 mg/dL (ref 0.44–1.00)
GFR, Estimated: >60 mL/min (ref >60)
Glucose, Bld: 340 mg/dL — ABNORMAL HIGH (ref 70–99)
Potassium: 3.8 mmol/L (ref 3.5–5.1)
Sodium: 136 mmol/L (ref 135–145)
Total Bilirubin: 0.4 mg/dL (ref 0.3–1.2)
Total Protein: 7.1 g/dL (ref 6.5–8.1)

## 2022-04-21 LAB — I-STAT BETA HCG BLOOD, ED (MC, WL, AP ONLY): I-stat hCG, quantitative: <5 m[IU]/mL (ref <5)

## 2022-04-21 NOTE — ED Triage Notes (Signed)
Pt reports she was diagnosed with a UTI a week ago at [facility]. She has finished taking Keflex but the pain in her abd has worsened.

## 2022-04-21 NOTE — ED Provider Triage Note (Signed)
Emergency Medicine Provider Triage Evaluation Note  Caitlin Faulkner , a 46 y.o. female  was evaluated in triage.  Pt complains of abdominal pain and dysuria.  States she was recently seen at [facility] and had CT scan which was negative aside from gas in her bladder from recent cath during tubal ligation.  She states she was placed on Keflex for 1 week but continues to feel symptoms.  She has not had any vomiting.  She denies fever.  Review of Systems  Positive: Dysuria, abdominal pain Negative: fever  Physical Exam  BP 127/82 (BP Location: Right Arm)   Pulse (!) 110   Temp 97.7 F (36.5 C) (Oral)   Resp 20   SpO2 98%   Gen:   Awake, no distress   Resp:  Normal effort  MSK:   Moves extremities without difficulty  Other:    Medical Decision Making  Medically screening exam initiated at 10:48 PM.  Appropriate orders placed.  Radhika Dershem was informed that the remainder of the evaluation will be completed by another provider, this initial triage assessment does not replace that evaluation, and the importance of remaining in the ED until their evaluation is complete.  Abdominal pain, dysuria.  Recent work up/CT at [facility] 04/12/22 that was negative aside from presumed UTI.  She has been on Keflex for the past week.  We will recheck labs, UA with culture.   Larene Pickett, PA-C 04/21/22 2250

## 2022-04-22 NOTE — ED Notes (Signed)
Pt name called 3x for updated vitals, no response  

## 2022-04-23 LAB — URINE CULTURE: Culture: <10000 — AB

## 2022-07-21 ENCOUNTER — Encounter (HOSPITAL_BASED_OUTPATIENT_CLINIC_OR_DEPARTMENT_OTHER): Payer: Self-pay

## 2022-07-21 ENCOUNTER — Emergency Department (HOSPITAL_BASED_OUTPATIENT_CLINIC_OR_DEPARTMENT_OTHER): Payer: 59

## 2022-07-21 ENCOUNTER — Other Ambulatory Visit: Payer: Self-pay

## 2022-07-21 ENCOUNTER — Emergency Department (HOSPITAL_BASED_OUTPATIENT_CLINIC_OR_DEPARTMENT_OTHER)
Admission: EM | Admit: 2022-07-21 | Discharge: 2022-07-21 | Disposition: A | Discharge status: Home / Self Care | Payer: 59 | Attending: Emergency Medicine | Admitting: Emergency Medicine

## 2022-07-21 DIAGNOSIS — R1084 Generalized abdominal pain: Secondary | ICD-10-CM | POA: Insufficient documentation

## 2022-07-21 DIAGNOSIS — Z5329 Procedure and treatment not carried out because of patient's decision for other reasons: Secondary | ICD-10-CM | POA: Insufficient documentation

## 2022-07-21 DIAGNOSIS — Z794 Long term (current) use of insulin: Secondary | ICD-10-CM | POA: Diagnosis not present

## 2022-07-21 DIAGNOSIS — R110 Nausea: Secondary | ICD-10-CM | POA: Diagnosis not present

## 2022-07-21 DIAGNOSIS — K8689 Other specified diseases of pancreas: Secondary | ICD-10-CM | POA: Diagnosis not present

## 2022-07-21 DIAGNOSIS — E119 Type 2 diabetes mellitus without complications: Secondary | ICD-10-CM | POA: Diagnosis not present

## 2022-07-21 DIAGNOSIS — R0789 Other chest pain: Secondary | ICD-10-CM | POA: Diagnosis not present

## 2022-07-21 DIAGNOSIS — R079 Chest pain, unspecified: Secondary | ICD-10-CM

## 2022-07-21 DIAGNOSIS — Q438 Other specified congenital malformations of intestine: Secondary | ICD-10-CM | POA: Diagnosis not present

## 2022-07-21 DIAGNOSIS — R1032 Left lower quadrant pain: Secondary | ICD-10-CM | POA: Diagnosis not present

## 2022-07-21 DIAGNOSIS — Z7984 Long term (current) use of oral hypoglycemic drugs: Secondary | ICD-10-CM | POA: Insufficient documentation

## 2022-07-21 HISTORY — DX: Chest pain, unspecified: R07.9

## 2022-07-21 LAB — COMPREHENSIVE METABOLIC PANEL
ALT: 35 U/L (ref 0–44)
AST: 31 U/L (ref 15–41)
Albumin: 3.7 g/dL (ref 3.5–5.0)
Alkaline Phosphatase: 132 U/L — ABNORMAL HIGH (ref 38–126)
Anion gap: 9 (ref 5–15)
BUN: 18 mg/dL (ref 6–20)
CO2: 26 mmol/L (ref 22–32)
Calcium: 8.9 mg/dL (ref 8.9–10.3)
Chloride: 97 mmol/L — ABNORMAL LOW (ref 98–111)
Creatinine, Ser: 0.87 mg/dL (ref 0.44–1.00)
GFR, Estimated: >60 mL/min (ref >60)
Glucose, Bld: 370 mg/dL — ABNORMAL HIGH (ref 70–99)
Potassium: 3.7 mmol/L (ref 3.5–5.1)
Sodium: 132 mmol/L — ABNORMAL LOW (ref 135–145)
Total Bilirubin: 0.3 mg/dL (ref 0.3–1.2)
Total Protein: 7.8 g/dL (ref 6.5–8.1)

## 2022-07-21 LAB — LIPASE, BLOOD: Lipase: 42 U/L (ref 11–51)

## 2022-07-21 LAB — CBC
HCT: 44.3 % (ref 36.0–46.0)
Hemoglobin: 14.8 g/dL (ref 12.0–15.0)
MCH: 27.8 pg (ref 26.0–34.0)
MCHC: 33.4 g/dL (ref 30.0–36.0)
MCV: 83.1 fL (ref 80.0–100.0)
Platelets: 267 10*3/uL (ref 150–400)
RBC: 5.33 MIL/uL — ABNORMAL HIGH (ref 3.87–5.11)
RDW: 12.3 % (ref 11.5–15.5)
WBC: 6.5 10*3/uL (ref 4.0–10.5)
nRBC: 0 % (ref 0.0–0.2)

## 2022-07-21 LAB — URINALYSIS, ROUTINE W REFLEX MICROSCOPIC
Bilirubin Urine: NEGATIVE
Glucose, UA: >=500 mg/dL — AB
Hgb urine dipstick: NEGATIVE
Ketones, ur: NEGATIVE mg/dL
Leukocytes,Ua: NEGATIVE
Nitrite: POSITIVE — AB
Protein, ur: NEGATIVE mg/dL
Specific Gravity, Urine: 1.01 (ref 1.005–1.030)
pH: 5 (ref 5.0–8.0)

## 2022-07-21 LAB — URINALYSIS, MICROSCOPIC (REFLEX)

## 2022-07-21 LAB — PREGNANCY, URINE: Preg Test, Ur: NEGATIVE

## 2022-07-21 MED ORDER — CEPHALEXIN 250 MG PO CAPS
500.0000 mg | ORAL_CAPSULE | Freq: Once | ORAL | Status: DC
  Filled 2022-07-21: qty 2

## 2022-07-21 MED ORDER — CEPHALEXIN 500 MG PO CAPS: 500.0000 mg | ORAL_CAPSULE | Freq: Two times a day (BID) | ORAL | 0 refills | Status: AC

## 2022-07-21 NOTE — ED Triage Notes (Signed)
Pt reports LLQ abd pain that radiates to her back, onset last night associated with nausea. She denies vomiting or diarrhea. She reports she had the same symptoms 3 months ago and went to [facility] and was told she had gas in her bladder. She reports this feels similar but the pain is worse. She reports "when I pee its dark and frothy sounding. It makes a bubble sound. It smells really bad."

## 2022-07-21 NOTE — ED Provider Notes (Signed)
[facility] Provider Note   CSN: [id number] Arrival date & time: 07/21/22  1624     History  Chief Complaint  Patient presents with   Abdominal Pain    Caitlin Faulkner is a 47 y.o. female.  The history is provided by the patient.  Abdominal Pain Pain location:  Generalized Pain quality: aching   Pain radiates to:  Does not radiate Pain severity:  No pain Onset quality:  Gradual Timing:  Constant Context: not recent illness   Relieved by:  Nothing Worsened by:  Nothing Ineffective treatments:  None tried Associated symptoms: constipation and dysuria   Associated symptoms: no anorexia, no belching and no nausea        Home Medications Prior to Admission medications   Medication Sig Start Date End Date Taking? Authorizing Provider  cephALEXin (KEFLEX) 500 MG capsule Take 1 capsule (500 mg total) by mouth 2 (two) times daily for 3 days. 07/21/22 07/24/22 Yes Lindy Pennisi, DO  ACCU-CHEK FASTCLIX LANCETS MISC 1 Device by Does not apply route 4 (four) times daily. 03/23/11   Shores, Yisroel Ramming, CNM  insulin aspart protamine- aspart (NOVOLOG MIX 70/30) (70-30) 100 UNIT/ML injection Inject 30 Units into the skin 2 (two) times daily.    [provider]  metFORMIN (GLUCOPHAGE) 850 MG tablet Take 1 tablet (850 mg total) by mouth 2 (two) times daily with a meal. Patient not taking: Reported on 04/21/2022 05/26/11 05/25/12  Sharlynn Oliphant, MD  pantoprazole ([place]) 40 MG tablet TAKE ONE TABLET BY MOUTH EVERY DAY Patient not taking: Reported on 04/21/2022 09/06/11   Woodroe Mode, MD      Allergies    Codeine    Review of Systems   Review of Systems  Gastrointestinal:  Positive for abdominal pain and constipation. Negative for anorexia and nausea.  Genitourinary:  Positive for dysuria.    Physical Exam Updated Vital Signs BP (!) 150/85 (BP Location: Right Arm)   Pulse 76   Temp 98.2 F (36.8 C) (Oral)   Resp 20    Ht 5\' 9"  (1.753 m)   Wt 74.8 kg   LMP 07/21/2020   SpO2 99%   BMI 24.37 kg/m  Physical Exam Vitals and nursing note reviewed.  Constitutional:      General: She is not in acute distress.    Appearance: She is well-developed.  HENT:     Head: Normocephalic and atraumatic.  Eyes:     Conjunctiva/sclera: Conjunctivae normal.  Cardiovascular:     Rate and Rhythm: Normal rate and regular rhythm.     Heart sounds: No murmur heard. Pulmonary:     Effort: Pulmonary effort is normal. No respiratory distress.     Breath sounds: Normal breath sounds.  Abdominal:     Palpations: Abdomen is soft.     Tenderness: There is generalized abdominal tenderness.  Musculoskeletal:        General: No swelling.     Cervical back: Neck supple.  Skin:    General: Skin is warm and dry.     Capillary Refill: Capillary refill takes less than 2 seconds.  Neurological:     Mental Status: She is alert.  Psychiatric:        Mood and Affect: Mood normal.     ED Results / Procedures / Treatments   Labs (all labs ordered are listed, but only abnormal results are displayed) Labs Reviewed  COMPREHENSIVE METABOLIC PANEL - Abnormal; Notable for the following components:  Result Value   Sodium 132 (*)    Chloride 97 (*)    Glucose, Bld 370 (*)    Alkaline Phosphatase 132 (*)    All other components within normal limits  CBC - Abnormal; Notable for the following components:   RBC 5.33 (*)    All other components within normal limits  URINALYSIS, ROUTINE W REFLEX MICROSCOPIC - Abnormal; Notable for the following components:   Glucose, UA >=500 (*)    Nitrite POSITIVE (*)    All other components within normal limits  URINALYSIS, MICROSCOPIC (REFLEX) - Abnormal; Notable for the following components:   Bacteria, UA MANY (*)    All other components within normal limits  LIPASE, BLOOD  PREGNANCY, URINE    EKG None  Radiology CT Renal Stone Study  Result Date: 07/21/2022 CLINICAL DATA:   Flank pain.  Left lower quadrant pain nausea EXAM: CT ABDOMEN AND PELVIS WITHOUT CONTRAST TECHNIQUE: Multidetector CT imaging of the abdomen and pelvis was performed following the standard protocol without IV contrast. RADIATION DOSE REDUCTION: This exam was performed according to the departmental dose-optimization program which includes automated exposure control, adjustment of the mA and/or kV according to patient size and/or use of iterative reconstruction technique. COMPARISON:  CT 04/12/2022 and older FINDINGS: Lower chest: Lung bases are clear. No pleural effusion. Mild breathing motion. Slight nodular thickening along the pericardium, increased from the study of October 2023. This is of uncertain etiology and significance. Recommend follow-up. Hepatobiliary: On this non IV contrast exam, grossly the liver is unremarkable. No space-occupying lesion. Gallbladder is nondilated. Pancreas: Mild pancreatic atrophy without obvious mass lesion or ductal dilatation. Unchanged from previous. Spleen: Spleen is nonenlarged. Adrenals/Urinary Tract: Right adrenal gland is unremarkable. Left is slightly nodular and thickened superiorly but unchanged from previous. Focal area is seen best on coronal image 40 of series 5 measuring 12 mm with Hounsfield units less than 0 consistent with an adenoma. No abnormal calcifications are seen within either kidney nor along the expected course of either ureter. Preserved contours of the urinary bladder. Stomach/Bowel: On this non oral contrast exam there is diffuse moderate colonic stool. No bowel dilatation. There is a redundant course to the sigmoid colon extending into the left mid abdomen. There is a normal appendix extending posterior to the cecum in the right hemipelvis. Some distal small bowel stool appearance identified, nonspecific. The small bowel itself is nondilated. There is moderate debris of the stomach. Vascular/Lymphatic: Mild atherosclerotic calcifications. Normal  caliber aorta and IVC. Reproductive: Uterus and ovaries are present. There is a right-sided tubal ligation clip. No left-sided clip clearly seen. Other: No ascites. Musculoskeletal: Mild degenerative changes along the spine. IMPRESSION: 1. No evidence of renal stone or obstruction. 2. Moderate colonic stool. No evidence of bowel obstruction. Normal appendix. 3. Slight nodular thickening along the pericardium, increased from the study of October 2023. This is of uncertain etiology and significance. Recommend follow-up. Electronically Signed   By: Karen Kays M.D.   On: 07/21/2022 18:07    Procedures Procedures    Medications Ordered in ED Medications  cephALEXin (KEFLEX) capsule 500 mg (500 mg Oral Patient Refused/Not Given 07/21/22 2048)    ED Course/ Medical Decision Making/ A&P                             Medical Decision Making Amount and/or Complexity of Data Reviewed Labs: ordered. Radiology: ordered.  Risk Prescription drug management.   Wil[employer] Sports  Is here with abdominal pain and pain with urination.  History of diabetes.  History of kidney stones as well.  Differential diagnosis UTI versus kidney stones versus constipation versus less likely bowel obstruction.  CBC, CMP, urinalysis, CT scan abdomen pelvis ordered.  Per my review and interpretation of labs may be UTI but otherwise lab works unremarkable.  CT scan unremarkable.  May be constipation.  No significant anemia or electrolyte abnormality per my review and interpretation of labs.  CT scan showed moderate stool burden.  She had some increased thickening of her pericardium that was seen on prior study.  Will have her follow-up with cardiology.  She is asymptomatic from that standpoint.  This is a incidental finding.  Patient discharged in good condition.  Understands return precautions.  This chart was dictated using voice recognition software.  Despite best efforts to proofread,  errors can occur which can change the  documentation meaning.         Final Clinical Impression(s) / ED Diagnoses Final diagnoses:  Generalized abdominal pain    Rx / DC Orders ED Discharge Orders          Ordered    cephALEXin (KEFLEX) 500 MG capsule  2 times daily        07/21/22 2033              Lennice Sites, DO 07/21/22 2103

## 2022-08-11 DIAGNOSIS — Z87442 Personal history of urinary calculi: Secondary | ICD-10-CM | POA: Insufficient documentation

## 2022-08-12 ENCOUNTER — Encounter: Payer: Self-pay | Admitting: Cardiology

## 2022-08-12 ENCOUNTER — Ambulatory Visit: Payer: 59 | Attending: Cardiology | Admitting: Cardiology

## 2022-08-12 VITALS — BP 118/80 | HR 118 | Ht 69.0 in | Wt 161.0 lb

## 2022-08-12 DIAGNOSIS — Z01812 Encounter for preprocedural laboratory examination: Secondary | ICD-10-CM | POA: Diagnosis not present

## 2022-08-12 DIAGNOSIS — E1165 Type 2 diabetes mellitus with hyperglycemia: Secondary | ICD-10-CM | POA: Insufficient documentation

## 2022-08-12 DIAGNOSIS — F17210 Nicotine dependence, cigarettes, uncomplicated: Secondary | ICD-10-CM

## 2022-08-12 DIAGNOSIS — E0865 Diabetes mellitus due to underlying condition with hyperglycemia: Secondary | ICD-10-CM

## 2022-08-12 DIAGNOSIS — I209 Angina pectoris, unspecified: Secondary | ICD-10-CM | POA: Insufficient documentation

## 2022-08-12 MED ORDER — NITROGLYCERIN 0.4 MG SL SUBL: 0.4000 mg | SUBLINGUAL_TABLET | SUBLINGUAL | 3 refills | Status: AC | PRN

## 2022-08-12 MED ORDER — METOPROLOL SUCCINATE ER 25 MG PO TB24: 25.0000 mg | ORAL_TABLET | Freq: Every day | ORAL | 5 refills | Status: AC

## 2022-08-12 NOTE — Patient Instructions (Addendum)
Medication Instructions: Your physician has recommended you make the following change in your medication:  Start 81 MG coated aspirin once daily Start Toprol XL 25 mg once daily Nitroglycerin 0.4 mg sublingual (under your tongue) as needed for chest pain. If experiencing chest pain, stop what you are doing and sit down. Take 1 nitroglycerin and wait 5 minutes. If chest pain continues, take another nitroglycerin and wait 5 minutes. If chest pain does not subside, take 1 more nitroglycerin and dial 911. You make take a total of 3 nitroglycerin in a 15 minute time frame.   *If you need a refill on your cardiac medications before your next appointment, please call your pharmacy*   Lab Work:  If you have labs (blood work) drawn today and your tests are completely normal, you will receive your results only by: [place] (if you have MyChart) OR A paper copy in the mail If you have any lab test that is abnormal or we need to change your treatment, we will call you to review the results.   Testing/Procedures:  [place] A DEPT OF [facility] [place], [place] [id number] [place] [address] Dept: [phone] Loc: [place]  08/12/2022  You are scheduled for a Cardiac Catheterization on Wednesday, February 21 with Dr. Shelva Majestic.  1. Please arrive at the [facility] (Main Entrance A) at [facility]: [address] [place], [place] [address] at 9:30 AM (This time is two hours before your procedure to ensure your preparation). Free valet parking service is available.   Special note: Every effort is made to have your procedure done on time. Please understand that emergencies sometimes delay scheduled procedures.  2. Diet: Do not eat solid foods after midnight.  The patient may have clear liquids until 5am upon the day of the procedure.  3. Labs: You will need  to have blood drawn on , February 14 at [facility]: [address] [place], [place]. You do not need to be fasting.  4. Medication instructions in preparation for your procedure:   Contrast Allergy: No     Take only 20 units of insulin the night before your procedure. Do not take any insulin on the day of the procedure.    On the morning of your procedure, take your Aspirin 81 mg and any morning medicines NOT listed above.  You may use sips of water.  5. Plan for one night stay--bring personal belongings. 6. Bring a current list of your medications and current insurance cards. 7. You MUST have a responsible person to drive you home. 8. Someone MUST be with you the first 24 hours after you arrive home or your discharge will be delayed. 9. Please wear clothes that are easy to get on and off and wear slip-on shoes.  Thank you for allowing [place] to care for you!   -- [facility]    Follow-Up: At [facility], you and your health needs are our priority.  As part of our continuing mission to provide you with exceptional heart care, we have created designated Provider Care Teams.  These Care Teams include your primary Cardiologist (physician) and Advanced Practice Providers (APPs -  Physician Assistants and Nurse Practitioners) who all work together to provide you with the care you need, when you need it.  We recommend signing up for the patient portal called "  MyChart".  Sign up information is provided on this After Visit Summary.  MyChart is used to connect with patients for Virtual Visits (Telemedicine).  Patients are able to view lab/test results, encounter notes, upcoming appointments, etc.  Non-urgent messages can be sent to your provider as well.   To learn more about what you can do with MyChart, go to [email or web address].    Your next appointment:   6 month(s)  Provider:   Dr. Sunny Schlein Revankar [facility]  Other  Instructions

## 2022-08-12 NOTE — H&P (View-Only) (Signed)
Cardiology Office Note:    Date:  08/12/2022   ID:  Caitlin Faulkner, DOB January 24, 1976, MRN [id number]  PCP:  Pcp, No  Cardiologist:  Jenean Lindau, MD   Referring MD: Lennice Sites, DO    ASSESSMENT:    1. Diabetes mellitus due to underlying condition, uncontrolled, with hyperglycemia ([place])   2. Angina pectoris ([place])   3. Cigarette smoker    PLAN:    In order of problems listed above:  Primary prevention stressed with the patient.  Importance of compliance with diet medication stressed and she vocalized understanding. Angina pectoris: Patient's symptoms are very concerning.  Following recommendations were given to the patient.  She was advised to take a coated baby aspirin and Toprol-XL 25 mg daily.  Sublingual nitroglycerin prescription was sent, its protocol and 911 protocol explained and the patient vocalized understanding questions were answered to the patient's satisfaction.In view of the patient's symptoms, I discussed with the patient options for evaluation. Invasive and noninvasive options were given to the patient. I discussed stress testing and coronary angiography and left heart catheterization at length. Benefits, pros and cons of each approach were discussed at length. Patient had multiple questions which were answered to the patient's satisfaction. Patient opted for invasive evaluation and we will set up for coronary angiography and left heart catheterization. Further recommendations will be made based on the findings with coronary angiography. In the interim if the patient has any significant symptoms in hospital to the nearest emergency room.  I also gave her options of CT coronary angiography with FFR but she is not keen on it. Diabetes mellitus: Uncontrolled.  Diet emphasized.  This is managed by primary care. Cigarette smoker: I spent 5 minutes with the patient discussing solely about smoking. Smoking cessation was counseled. I suggested to the patient also different  medications and pharmacological interventions. Patient is keen to try stopping on its own at this time. He will get back to me if he needs any further assistance in this matter. She will be seen in follow-up appointment after the coronary angiography.   Medication Adjustments/Labs and Tests Ordered: Current medicines are reviewed at length with the patient today.  Concerns regarding medicines are outlined above.  No orders of the defined types were placed in this encounter.  No orders of the defined types were placed in this encounter.    History of Present Illness:    Caitlin Faulkner is a 47 y.o. female who is being seen today for the evaluation of chest pain at the request of Lennice Sites, DO.  Patient is a pleasant 47 year old female.  She was examined in the presence of Dondra Prader our receptionist who served as chaperone for the entire visit.  Patient mentions to me that upon stress or exertion she has chest tightness going to the left arm.  She has history of diabetes mellitus and has been long-term smoker.  She quit smoking few days ago.  She is now vaping.  No orthopnea or PND.  Chest tightness symptoms, consistently on exertion and have concerned her.  At the time of my evaluation, the patient is alert awake oriented and in no distress.  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  Will treat in labor.   History of cocaine abuse ([place]) 03/18/2011   [id number] has stopped using   History of kidney stones    History of preterm delivery, currently pregnant 04/06/2011   35 week delivery   Preterm labor    STD (sexually transmitted disease) 11/28/2010   Trich, tx'd    Type II diabetes mellitus ([place])  12/03/2012    Past Surgical History:  Procedure Laterality Date   CESAREAN SECTION     TUBAL LIGATION  05/25/2011   Procedure: POST PARTUM TUBAL LIGATION;  Surgeon: Osborne Oman, MD;  Location: [facility];  Service: Gynecology;  Laterality: Bilateral;  with filshie clips   Urinary stent  2002    Current Medications: Current Meds  Medication Sig   ACCU-CHEK FASTCLIX LANCETS MISC 1 Device by Does not apply route 4 (four) times daily.   insulin aspart protamine- aspart (NOVOLOG MIX 70/30) (70-30) 100 UNIT/ML injection Inject 30 Units into the skin 2 (two) times daily.   pantoprazole (PROTONIX) 40 MG tablet TAKE ONE TABLET BY MOUTH EVERY DAY     Allergies:   Codeine   Social History   Socioeconomic History   Marital status: Married    Spouse name: Not on file   Number of children: Not on file   Years of education: Not on file   Highest education level: Not on file  Occupational History   Not on file  Tobacco Use   Smoking status: Every Day    Packs/day: 0.10    Types: Cigarettes   Smokeless tobacco: Never  Substance and Sexual Activity   Alcohol use: No   Drug use: No   Sexual activity: Yes    Birth control/protection: None  Other Topics Concern   Not on file  Social History Narrative   Not on file   Social Determinants of Health   Financial Resource Strain: Not on file  Food Insecurity: Not on file  Transportation Needs: Not on file  Physical Activity: Not on file  Stress: Not on file  Social Connections: Not on file     Family History: The patient's family history is negative for Anesthesia problems, Hypotension, Malignant hyperthermia, and Pseudochol deficiency.  ROS:   Please see the history of present illness.    All other systems reviewed and are negative.  EKGs/Labs/Other Studies Reviewed:    The following studies were reviewed today: EKG reveals sinus tachycardia and nonspecific ST-T changes.   Recent Labs: 07/21/2022: ALT 35; BUN 18;  Creatinine, Ser 0.87; Hemoglobin 14.8; Platelets 267; Potassium 3.7; Sodium 132  Recent Lipid Panel No results found for: "CHOL", "TRIG", "HDL", "CHOLHDL", "VLDL", "LDLCALC", "LDLDIRECT"  Physical Exam:    VS:  BP 118/80   Pulse (!) 118   Ht 5' 9"$  (1.753 m)   Wt 161 lb (73 kg)   LMP 07/21/2020   SpO2 97%   BMI 23.78 kg/m     Wt Readings from Last 3 Encounters:  08/12/22 161 lb (73 kg)  07/21/22 165 lb (74.8 kg)  04/21/22 167 lb (75.8 kg)     GEN: Patient is in no acute distress HEENT: Normal NECK: No JVD; No carotid bruits LYMPHATICS: No lymphadenopathy CARDIAC: S1 S2 regular, 2/6 systolic murmur at the apex. RESPIRATORY:  Clear to auscultation without rales, wheezing or rhonchi  ABDOMEN: Soft, non-tender, non-distended MUSCULOSKELETAL:  No edema; No deformity  SKIN: Warm and dry NEUROLOGIC:  Alert and oriented x 3 PSYCHIATRIC:  Normal affect    Signed,  Jenean Lindau, MD  08/12/2022 3:20 PM    [facility]

## 2022-08-12 NOTE — Progress Notes (Signed)
Cardiology Office Note:    Date:  08/12/2022   ID:  Caitlin Faulkner, DOB 05-04-1976, MRN [id number]  PCP:  Pcp, No  Cardiologist:  Jenean Lindau, MD   Referring MD: Lennice Sites, DO    ASSESSMENT:    1. Diabetes mellitus due to underlying condition, uncontrolled, with hyperglycemia ([place])   2. Angina pectoris ([place])   3. Cigarette smoker    PLAN:    In order of problems listed above:  Primary prevention stressed with the patient.  Importance of compliance with diet medication stressed and she vocalized understanding. Angina pectoris: Patient's symptoms are very concerning.  Following recommendations were given to the patient.  She was advised to take a coated baby aspirin and Toprol-XL 25 mg daily.  Sublingual nitroglycerin prescription was sent, its protocol and 911 protocol explained and the patient vocalized understanding questions were answered to the patient's satisfaction.In view of the patient's symptoms, I discussed with the patient options for evaluation. Invasive and noninvasive options were given to the patient. I discussed stress testing and coronary angiography and left heart catheterization at length. Benefits, pros and cons of each approach were discussed at length. Patient had multiple questions which were answered to the patient's satisfaction. Patient opted for invasive evaluation and we will set up for coronary angiography and left heart catheterization. Further recommendations will be made based on the findings with coronary angiography. In the interim if the patient has any significant symptoms in hospital to the nearest emergency room.  I also gave her options of CT coronary angiography with FFR but she is not keen on it. Diabetes mellitus: Uncontrolled.  Diet emphasized.  This is managed by primary care. Cigarette smoker: I spent 5 minutes with the patient discussing solely about smoking. Smoking cessation was counseled. I suggested to the patient also different  medications and pharmacological interventions. Patient is keen to try stopping on its own at this time. He will get back to me if he needs any further assistance in this matter. She will be seen in follow-up appointment after the coronary angiography.   Medication Adjustments/Labs and Tests Ordered: Current medicines are reviewed at length with the patient today.  Concerns regarding medicines are outlined above.  No orders of the defined types were placed in this encounter.  No orders of the defined types were placed in this encounter.    History of Present Illness:    Caitlin Faulkner is a 47 y.o. female who is being seen today for the evaluation of chest pain at the request of Lennice Sites, DO.  Patient is a pleasant 47 year old female.  She was examined in the presence of Dondra Prader our receptionist who served as chaperone for the entire visit.  Patient mentions to me that upon stress or exertion she has chest tightness going to the left arm.  She has history of diabetes mellitus and has been long-term smoker.  She quit smoking few days ago.  She is now vaping.  No orthopnea or PND.  Chest tightness symptoms, consistently on exertion and have concerned her.  At the time of my evaluation, the patient is alert awake oriented and in no distress.  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  Will treat in labor.   History of cocaine abuse ([place]) 03/18/2011   [id number] has stopped using   History of kidney stones    History of preterm delivery, currently pregnant 04/06/2011   35 week delivery   Preterm labor    STD (sexually transmitted disease) 11/28/2010   Trich, tx'd    Type II diabetes mellitus ([place])  12/03/2012    Past Surgical History:  Procedure Laterality Date   CESAREAN SECTION     TUBAL LIGATION  05/25/2011   Procedure: POST PARTUM TUBAL LIGATION;  Surgeon: Osborne Oman, MD;  Location: [facility];  Service: Gynecology;  Laterality: Bilateral;  with filshie clips   Urinary stent  2002    Current Medications: Current Meds  Medication Sig   ACCU-CHEK FASTCLIX LANCETS MISC 1 Device by Does not apply route 4 (four) times daily.   insulin aspart protamine- aspart (NOVOLOG MIX 70/30) (70-30) 100 UNIT/ML injection Inject 30 Units into the skin 2 (two) times daily.   pantoprazole (PROTONIX) 40 MG tablet TAKE ONE TABLET BY MOUTH EVERY DAY     Allergies:   Codeine   Social History   Socioeconomic History   Marital status: Married    Spouse name: Not on file   Number of children: Not on file   Years of education: Not on file   Highest education level: Not on file  Occupational History   Not on file  Tobacco Use   Smoking status: Every Day    Packs/day: 0.10    Types: Cigarettes   Smokeless tobacco: Never  Substance and Sexual Activity   Alcohol use: No   Drug use: No   Sexual activity: Yes    Birth control/protection: None  Other Topics Concern   Not on file  Social History Narrative   Not on file   Social Determinants of Health   Financial Resource Strain: Not on file  Food Insecurity: Not on file  Transportation Needs: Not on file  Physical Activity: Not on file  Stress: Not on file  Social Connections: Not on file     Family History: The patient's family history is negative for Anesthesia problems, Hypotension, Malignant hyperthermia, and Pseudochol deficiency.  ROS:   Please see the history of present illness.    All other systems reviewed and are negative.  EKGs/Labs/Other Studies Reviewed:    The following studies were reviewed today: EKG reveals sinus tachycardia and nonspecific ST-T changes.   Recent Labs: 07/21/2022: ALT 35; BUN 18;  Creatinine, Ser 0.87; Hemoglobin 14.8; Platelets 267; Potassium 3.7; Sodium 132  Recent Lipid Panel No results found for: "CHOL", "TRIG", "HDL", "CHOLHDL", "VLDL", "LDLCALC", "LDLDIRECT"  Physical Exam:    VS:  BP 118/80   Pulse (!) 118   Ht 5' 9"$  (1.753 m)   Wt 161 lb (73 kg)   LMP 07/21/2020   SpO2 97%   BMI 23.78 kg/m     Wt Readings from Last 3 Encounters:  08/12/22 161 lb (73 kg)  07/21/22 165 lb (74.8 kg)  04/21/22 167 lb (75.8 kg)     GEN: Patient is in no acute distress HEENT: Normal NECK: No JVD; No carotid bruits LYMPHATICS: No lymphadenopathy CARDIAC: S1 S2 regular, 2/6 systolic murmur at the apex. RESPIRATORY:  Clear to auscultation without rales, wheezing or rhonchi  ABDOMEN: Soft, non-tender, non-distended MUSCULOSKELETAL:  No edema; No deformity  SKIN: Warm and dry NEUROLOGIC:  Alert and oriented x 3 PSYCHIATRIC:  Normal affect    Signed,  Jenean Lindau, MD  08/12/2022 3:20 PM    [facility]

## 2022-08-13 LAB — CBC WITH DIFFERENTIAL/PLATELET
Basophils Absolute: 0 10*3/uL (ref 0.0–0.2)
Basos: 0 %
EOS (ABSOLUTE): 0.1 10*3/uL (ref 0.0–0.4)
Eos: 1 %
Hematocrit: 48.2 % — ABNORMAL HIGH (ref 34.0–46.6)
Hemoglobin: 15.7 g/dL (ref 11.1–15.9)
Immature Grans (Abs): 0 10*3/uL (ref 0.0–0.1)
Immature Granulocytes: 0 %
Lymphocytes Absolute: 2.6 10*3/uL (ref 0.7–3.1)
Lymphs: 24 %
MCH: 27.5 pg (ref 26.6–33.0)
MCHC: 32.6 g/dL (ref 31.5–35.7)
MCV: 84 fL (ref 79–97)
Monocytes Absolute: 0.7 10*3/uL (ref 0.1–0.9)
Monocytes: 7 %
Neutrophils Absolute: 7.3 10*3/uL — ABNORMAL HIGH (ref 1.4–7.0)
Neutrophils: 68 %
Platelets: 345 10*3/uL (ref 150–450)
RBC: 5.71 x10E6/uL — ABNORMAL HIGH (ref 3.77–5.28)
RDW: 12.2 % (ref 11.7–15.4)
WBC: 10.7 10*3/uL (ref 3.4–10.8)

## 2022-08-13 LAB — BASIC METABOLIC PANEL
BUN/Creatinine Ratio: 23 (ref 9–23)
BUN: 18 mg/dL (ref 6–24)
CO2: 21 mmol/L (ref 20–29)
Calcium: 9.9 mg/dL (ref 8.7–10.2)
Chloride: 99 mmol/L (ref 96–106)
Creatinine, Ser: 0.78 mg/dL (ref 0.57–1.00)
Glucose: 264 mg/dL — ABNORMAL HIGH (ref 70–99)
Potassium: 4.9 mmol/L (ref 3.5–5.2)
Sodium: 139 mmol/L (ref 134–144)
eGFR: 95 mL/min/{1.73_m2} (ref >59)

## 2022-08-18 ENCOUNTER — Telehealth: Payer: Self-pay | Admitting: *Deleted

## 2022-08-18 NOTE — Telephone Encounter (Signed)
Cardiac Catheterization scheduled at [facility] for: Wednesday August 19, 2022 11:30 AM Arrival time and place: [facility] Entrance A at: 9:30 AM  Nothing to eat after midnight prior to procedure, clear liquids until 5 AM day of procedure.  Medication instructions: -Hold:  Insulin-AM of procedure -Other usual morning medications can be taken with sips of water including aspirin 81 mg.  Confirmed patient has responsible adult to drive home post procedure and be with patient first 24 hours after arriving home.  Patient reports no new symptoms concerning for COVID-19 in the past 10 days.  Reviewed procedure instructions with patient.  Office has been unable to locate EKG done 08/12/22,

## 2022-08-19 ENCOUNTER — Encounter (HOSPITAL_COMMUNITY)
Admission: RE | Disposition: A | Discharge status: Home / Self Care | Payer: Self-pay | Source: Ambulatory Visit | Attending: Cardiovascular Disease

## 2022-08-19 ENCOUNTER — Ambulatory Visit (HOSPITAL_COMMUNITY)
Admission: RE | Admit: 2022-08-19 | Discharge: 2022-08-19 | Disposition: A | Discharge status: Home / Self Care | Payer: 59 | Source: Ambulatory Visit | Attending: Cardiovascular Disease | Admitting: Cardiovascular Disease

## 2022-08-19 ENCOUNTER — Other Ambulatory Visit: Payer: Self-pay

## 2022-08-19 DIAGNOSIS — E1165 Type 2 diabetes mellitus with hyperglycemia: Secondary | ICD-10-CM | POA: Diagnosis not present

## 2022-08-19 DIAGNOSIS — I209 Angina pectoris, unspecified: Secondary | ICD-10-CM | POA: Insufficient documentation

## 2022-08-19 DIAGNOSIS — Z794 Long term (current) use of insulin: Secondary | ICD-10-CM | POA: Insufficient documentation

## 2022-08-19 DIAGNOSIS — E0865 Diabetes mellitus due to underlying condition with hyperglycemia: Secondary | ICD-10-CM

## 2022-08-19 DIAGNOSIS — F17210 Nicotine dependence, cigarettes, uncomplicated: Secondary | ICD-10-CM | POA: Insufficient documentation

## 2022-08-19 HISTORY — PX: LEFT HEART CATH AND CORONARY ANGIOGRAPHY: CATH118249

## 2022-08-19 LAB — GLUCOSE, CAPILLARY
Glucose-Capillary: 251 mg/dL — ABNORMAL HIGH (ref 70–99)
Glucose-Capillary: 221 mg/dL — ABNORMAL HIGH (ref 70–99)

## 2022-08-19 SURGERY — LEFT HEART CATH AND CORONARY ANGIOGRAPHY
Anesthesia: LOCAL

## 2022-08-19 MED ORDER — SODIUM CHLORIDE 0.9 % IV SOLN: INTRAVENOUS | Status: DC

## 2022-08-19 MED ORDER — MIDAZOLAM HCL 2 MG/2ML IJ SOLN
INTRAMUSCULAR | Status: DC | PRN
  Administered 2022-08-19: 2 mg via INTRAVENOUS

## 2022-08-19 MED ORDER — LABETALOL HCL 5 MG/ML IV SOLN: 10.0000 mg | INTRAVENOUS | Status: DC | PRN

## 2022-08-19 MED ORDER — LIDOCAINE HCL (PF) 1 % IJ SOLN
INTRAMUSCULAR | Status: DC | PRN
  Administered 2022-08-19 (×2): 2 mL

## 2022-08-19 MED ORDER — VERAPAMIL HCL 2.5 MG/ML IV SOLN
INTRAVENOUS | Status: AC
  Filled 2022-08-19: qty 2

## 2022-08-19 MED ORDER — SODIUM CHLORIDE 0.9 % IV SOLN: 250.0000 mL | INTRAVENOUS | Status: DC | PRN

## 2022-08-19 MED ORDER — SODIUM CHLORIDE 0.9 % WEIGHT BASED INFUSION: 3.0000 mL/kg/h | INTRAVENOUS | Status: AC

## 2022-08-19 MED ORDER — SODIUM CHLORIDE 0.9% FLUSH: 3.0000 mL | INTRAVENOUS | Status: DC | PRN

## 2022-08-19 MED ORDER — HYDRALAZINE HCL 20 MG/ML IJ SOLN: 10.0000 mg | INTRAMUSCULAR | Status: DC | PRN

## 2022-08-19 MED ORDER — ASPIRIN 81 MG PO CHEW: 81.0000 mg | CHEWABLE_TABLET | ORAL | Status: DC

## 2022-08-19 MED ORDER — SODIUM CHLORIDE 0.9 % WEIGHT BASED INFUSION: 1.0000 mL/kg/h | INTRAVENOUS | Status: DC

## 2022-08-19 MED ORDER — ACETAMINOPHEN 325 MG PO TABS: 650.0000 mg | ORAL_TABLET | ORAL | Status: DC | PRN

## 2022-08-19 MED ORDER — HEPARIN SODIUM (PORCINE) 1000 UNIT/ML IJ SOLN
INTRAMUSCULAR | Status: DC | PRN
  Administered 2022-08-19: 3600 [IU] via INTRAVENOUS

## 2022-08-19 MED ORDER — SODIUM CHLORIDE 0.9% FLUSH: 3.0000 mL | Freq: Two times a day (BID) | INTRAVENOUS | Status: DC

## 2022-08-19 MED ORDER — HEPARIN (PORCINE) IN NACL 1000-0.9 UT/500ML-% IV SOLN
INTRAVENOUS | Status: DC | PRN
  Administered 2022-08-19 (×2): 500 mL

## 2022-08-19 MED ORDER — HEPARIN SODIUM (PORCINE) 1000 UNIT/ML IJ SOLN
INTRAMUSCULAR | Status: AC
  Filled 2022-08-19: qty 10

## 2022-08-19 MED ORDER — DIAZEPAM 5 MG PO TABS: 5.0000 mg | ORAL_TABLET | Freq: Four times a day (QID) | ORAL | Status: DC | PRN

## 2022-08-19 MED ORDER — ONDANSETRON HCL 4 MG/2ML IJ SOLN: 4.0000 mg | Freq: Four times a day (QID) | INTRAMUSCULAR | Status: DC | PRN

## 2022-08-19 MED ORDER — FENTANYL CITRATE (PF) 100 MCG/2ML IJ SOLN
INTRAMUSCULAR | Status: AC
  Filled 2022-08-19: qty 2

## 2022-08-19 MED ORDER — MIDAZOLAM HCL 2 MG/2ML IJ SOLN
INTRAMUSCULAR | Status: AC
  Filled 2022-08-19: qty 2

## 2022-08-19 MED ORDER — LIDOCAINE HCL (PF) 1 % IJ SOLN
INTRAMUSCULAR | Status: AC
  Filled 2022-08-19: qty 30

## 2022-08-19 MED ORDER — VERAPAMIL HCL 2.5 MG/ML IV SOLN
INTRAVENOUS | Status: DC | PRN
  Administered 2022-08-19: 10 mL via INTRA_ARTERIAL

## 2022-08-19 MED ORDER — IOHEXOL 350 MG/ML SOLN
INTRAVENOUS | Status: DC | PRN
  Administered 2022-08-19: 40 mL

## 2022-08-19 MED ORDER — FENTANYL CITRATE (PF) 100 MCG/2ML IJ SOLN
INTRAMUSCULAR | Status: DC | PRN
  Administered 2022-08-19: 25 ug via INTRAVENOUS

## 2022-08-19 SURGICAL SUPPLY — 12 items
BAND CMPR LRG ZPHR (HEMOSTASIS) ×1
BAND ZEPHYR COMPRESS 30 LONG (HEMOSTASIS) IMPLANT
CATH INFINITI JR4 5F (CATHETERS) IMPLANT
CATH OPTITORQUE TIG 4.0 5F (CATHETERS) IMPLANT
GLIDESHEATH SLEND SS 6F .021 (SHEATH) IMPLANT
GUIDEWIRE INQWIRE 1.5J.035X260 (WIRE) IMPLANT
INQWIRE 1.5J .035X260CM (WIRE) ×1
KIT HEART LEFT (KITS) ×1 IMPLANT
PACK CARDIAC CATHETERIZATION (CUSTOM PROCEDURE TRAY) ×1 IMPLANT
TRANSDUCER W/STOPCOCK (MISCELLANEOUS) ×1 IMPLANT
TUBING CIL FLEX 10 FLL-RA (TUBING) ×1 IMPLANT
WIRE MICROINTRODUCER 60CM (WIRE) IMPLANT

## 2022-08-19 NOTE — Interval H&P Note (Signed)
Cath Lab Visit (complete for each Cath Lab visit)  Clinical Evaluation Leading to the Procedure:   ACS: No.  Non-ACS:    Anginal Classification: CCS II  Anti-ischemic medical therapy: No Therapy  Non-Invasive Test Results: No non-invasive testing performed  Prior CABG: No previous CABG      History and Physical Interval Note:  08/19/2022 12:44 PM  Caitlin Faulkner  has presented today for surgery, with the diagnosis of chest pain.  The various methods of treatment have been discussed with the patient and family. After consideration of risks, benefits and other options for treatment, the patient has consented to  Procedure(s): LEFT HEART CATH AND CORONARY ANGIOGRAPHY (N/A) as a surgical intervention.  The patient's history has been reviewed, patient examined, no change in status, stable for surgery.  I have reviewed the patient's chart and labs.  Questions were answered to the patient's satisfaction.     Shelva Majestic

## 2022-08-19 NOTE — Discharge Instructions (Signed)

## 2022-08-20 ENCOUNTER — Encounter (HOSPITAL_COMMUNITY): Payer: Self-pay | Admitting: Cardiovascular Disease
